# Patient Record
Sex: Female | Born: 1990 | Race: White | Hispanic: No | Marital: Married | Smoking: Never smoker
Health system: Southern US, Community
[De-identification: ages and names within clinical notes are randomized; demographics above are authoritative.]

## PROBLEM LIST (undated history)

## (undated) DIAGNOSIS — Z8619 Personal history of other infectious and parasitic diseases: Secondary | ICD-10-CM

## (undated) DIAGNOSIS — R091 Pleurisy: Secondary | ICD-10-CM

## (undated) DIAGNOSIS — F329 Major depressive disorder, single episode, unspecified: Secondary | ICD-10-CM

## (undated) DIAGNOSIS — F419 Anxiety disorder, unspecified: Secondary | ICD-10-CM

## (undated) DIAGNOSIS — O24419 Gestational diabetes mellitus in pregnancy, unspecified control: Secondary | ICD-10-CM

## (undated) DIAGNOSIS — F32A Depression, unspecified: Secondary | ICD-10-CM

## (undated) DIAGNOSIS — I2699 Other pulmonary embolism without acute cor pulmonale: Secondary | ICD-10-CM

## (undated) HISTORY — PX: CHOLECYSTECTOMY: SHX55

---

## 2015-01-13 LAB — OB RESULTS CONSOLE ANTIBODY SCREEN: Antibody Screen: NEGATIVE

## 2015-01-13 LAB — OB RESULTS CONSOLE HIV ANTIBODY (ROUTINE TESTING): HIV: NONREACTIVE

## 2015-01-13 LAB — OB RESULTS CONSOLE RPR: RPR: NONREACTIVE

## 2015-01-13 LAB — OB RESULTS CONSOLE GC/CHLAMYDIA
CHLAMYDIA, DNA PROBE: NEGATIVE
Gonorrhea: NEGATIVE

## 2015-01-13 LAB — OB RESULTS CONSOLE ABO/RH: RH TYPE: POSITIVE

## 2015-01-13 LAB — OB RESULTS CONSOLE HEPATITIS B SURFACE ANTIGEN: HEP B S AG: NEGATIVE

## 2015-01-13 LAB — OB RESULTS CONSOLE GBS: GBS: POSITIVE

## 2015-01-13 LAB — OB RESULTS CONSOLE RUBELLA ANTIBODY, IGM: Rubella: IMMUNE

## 2015-08-04 ENCOUNTER — Encounter (HOSPITAL_COMMUNITY): Payer: Self-pay

## 2015-08-04 ENCOUNTER — Telehealth (HOSPITAL_COMMUNITY): Payer: Self-pay | Admitting: *Deleted

## 2015-08-04 NOTE — H&P (Signed)
Brianna Russell  DICTATION # [id number] CSN# [id number]   Brianna PicaHOLLAND,Brianna Fabrizio M, MD 08/04/2015 6:44 PM

## 2015-08-04 NOTE — Telephone Encounter (Signed)
Preadmission screen  

## 2015-08-05 ENCOUNTER — Inpatient Hospital Stay (HOSPITAL_COMMUNITY): Payer: 59 | Admitting: Anesthesiology

## 2015-08-05 ENCOUNTER — Encounter (HOSPITAL_COMMUNITY): Payer: Self-pay

## 2015-08-05 ENCOUNTER — Inpatient Hospital Stay (HOSPITAL_COMMUNITY)
Admission: AD | Admit: 2015-08-05 | Discharge: 2015-08-07 | DRG: 765 | Disposition: A | Discharge status: Home / Self Care | Payer: 59 | Source: Ambulatory Visit | Attending: Obstetrics and Gynecology | Admitting: Obstetrics and Gynecology

## 2015-08-05 ENCOUNTER — Encounter (HOSPITAL_COMMUNITY)
Admission: AD | Disposition: A | Discharge status: Home / Self Care | Payer: Self-pay | Source: Ambulatory Visit | Attending: Obstetrics and Gynecology

## 2015-08-05 DIAGNOSIS — O34211 Maternal care for low transverse scar from previous cesarean delivery: Secondary | ICD-10-CM | POA: Diagnosis present

## 2015-08-05 DIAGNOSIS — O24429 Gestational diabetes mellitus in childbirth, unspecified control: Principal | ICD-10-CM | POA: Diagnosis present

## 2015-08-05 DIAGNOSIS — Z6841 Body Mass Index (BMI) 40.0 and over, adult: Secondary | ICD-10-CM

## 2015-08-05 DIAGNOSIS — Z3A37 37 weeks gestation of pregnancy: Secondary | ICD-10-CM

## 2015-08-05 DIAGNOSIS — Z9119 Patient's noncompliance with other medical treatment and regimen: Secondary | ICD-10-CM | POA: Diagnosis not present

## 2015-08-05 DIAGNOSIS — O99824 Streptococcus B carrier state complicating childbirth: Secondary | ICD-10-CM | POA: Diagnosis present

## 2015-08-05 DIAGNOSIS — O99214 Obesity complicating childbirth: Secondary | ICD-10-CM | POA: Diagnosis present

## 2015-08-05 DIAGNOSIS — Z3490 Encounter for supervision of normal pregnancy, unspecified, unspecified trimester: Secondary | ICD-10-CM

## 2015-08-05 DIAGNOSIS — Z87891 Personal history of nicotine dependence: Secondary | ICD-10-CM | POA: Diagnosis not present

## 2015-08-05 LAB — TYPE AND SCREEN
ABO/RH(D): A POS
Antibody Screen: NEGATIVE

## 2015-08-05 LAB — BASIC METABOLIC PANEL
Anion gap: 8 (ref 5–15)
BUN: 8 mg/dL (ref 6–20)
CHLORIDE: 106 mmol/L (ref 101–111)
CO2: 24 mmol/L (ref 22–32)
Calcium: 9.3 mg/dL (ref 8.9–10.3)
Creatinine, Ser: 0.47 mg/dL (ref 0.44–1.00)
GFR calc Af Amer: >60 mL/min (ref >60)
GFR calc non Af Amer: >60 mL/min (ref >60)
GLUCOSE: 99 mg/dL (ref 65–99)
POTASSIUM: 4.2 mmol/L (ref 3.5–5.1)
Sodium: 138 mmol/L (ref 135–145)

## 2015-08-05 LAB — CBC
HEMATOCRIT: 35.5 % — AB (ref 36.0–46.0)
Hemoglobin: 11.6 g/dL — ABNORMAL LOW (ref 12.0–15.0)
MCH: 29.8 pg (ref 26.0–34.0)
MCHC: 32.7 g/dL (ref 30.0–36.0)
MCV: 91.3 fL (ref 78.0–100.0)
PLATELETS: 211 10*3/uL (ref 150–400)
RBC: 3.89 MIL/uL (ref 3.87–5.11)
RDW: 13.8 % (ref 11.5–15.5)
WBC: 5.8 10*3/uL (ref 4.0–10.5)

## 2015-08-05 LAB — GLUCOSE, CAPILLARY: GLUCOSE-CAPILLARY: 93 mg/dL (ref 65–99)

## 2015-08-05 LAB — ABO/RH: ABO/RH(D): A POS

## 2015-08-05 LAB — RPR: RPR: NONREACTIVE

## 2015-08-05 SURGERY — Surgical Case
Anesthesia: Spinal

## 2015-08-05 MED ORDER — SODIUM CHLORIDE 0.9% FLUSH: 3.0000 mL | Freq: Two times a day (BID) | INTRAVENOUS | Status: DC

## 2015-08-05 MED ORDER — ACETAMINOPHEN 160 MG/5ML PO SOLN: 325.0000 mg | ORAL | Status: DC | PRN

## 2015-08-05 MED ORDER — ONDANSETRON HCL 4 MG/2ML IJ SOLN
INTRAMUSCULAR | Status: AC
  Filled 2015-08-05: qty 2

## 2015-08-05 MED ORDER — LACTATED RINGERS IV SOLN
INTRAVENOUS | Status: DC | PRN
  Administered 2015-08-05 (×3): via INTRAVENOUS

## 2015-08-05 MED ORDER — DIBUCAINE 1 % RE OINT: 1.0000 "application " | TOPICAL_OINTMENT | RECTAL | Status: DC | PRN

## 2015-08-05 MED ORDER — NALBUPHINE HCL 10 MG/ML IJ SOLN: 5.0000 mg | Freq: Once | INTRAMUSCULAR | Status: DC | PRN

## 2015-08-05 MED ORDER — MORPHINE SULFATE (PF) 0.5 MG/ML IJ SOLN
INTRAMUSCULAR | Status: DC | PRN
  Administered 2015-08-05: .15 mg via EPIDURAL

## 2015-08-05 MED ORDER — NALBUPHINE HCL 10 MG/ML IJ SOLN: 5.0000 mg | INTRAMUSCULAR | Status: DC | PRN

## 2015-08-05 MED ORDER — KETOROLAC TROMETHAMINE 30 MG/ML IJ SOLN: 30.0000 mg | Freq: Four times a day (QID) | INTRAMUSCULAR | Status: DC | PRN

## 2015-08-05 MED ORDER — SODIUM CHLORIDE 0.9% FLUSH: 3.0000 mL | INTRAVENOUS | Status: DC | PRN

## 2015-08-05 MED ORDER — SODIUM CHLORIDE 0.9 % IV SOLN: 250.0000 mL | INTRAVENOUS | Status: DC

## 2015-08-05 MED ORDER — TETANUS-DIPHTH-ACELL PERTUSSIS 5-2.5-18.5 LF-MCG/0.5 IM SUSP: 0.5000 mL | Freq: Once | INTRAMUSCULAR | Status: DC

## 2015-08-05 MED ORDER — KETOROLAC TROMETHAMINE 30 MG/ML IJ SOLN
30.0000 mg | Freq: Four times a day (QID) | INTRAMUSCULAR | Status: DC | PRN
  Administered 2015-08-05: 30 mg via INTRAMUSCULAR

## 2015-08-05 MED ORDER — LACTATED RINGERS IV SOLN
INTRAVENOUS | Status: DC
  Administered 2015-08-05: 07:00:00 via INTRAVENOUS

## 2015-08-05 MED ORDER — MENTHOL 3 MG MT LOZG: 1.0000 | LOZENGE | OROMUCOSAL | Status: DC | PRN

## 2015-08-05 MED ORDER — DEXTROSE 5 % IV SOLN
1.0000 ug/kg/h | INTRAVENOUS | Status: DC | PRN
  Filled 2015-08-05: qty 2

## 2015-08-05 MED ORDER — FENTANYL CITRATE (PF) 100 MCG/2ML IJ SOLN
INTRAMUSCULAR | Status: AC
  Filled 2015-08-05: qty 2

## 2015-08-05 MED ORDER — OXYCODONE HCL 5 MG/5ML PO SOLN
5.0000 mg | ORAL | Status: DC | PRN
  Administered 2015-08-06 (×4): 5 mg via ORAL
  Filled 2015-08-05 (×3): qty 5

## 2015-08-05 MED ORDER — SODIUM CHLORIDE 0.9 % IR SOLN
Status: DC | PRN
Start: 2015-08-05 — End: 2015-08-05
  Administered 2015-08-05: 1

## 2015-08-05 MED ORDER — ACETAMINOPHEN 325 MG PO TABS: 650.0000 mg | ORAL_TABLET | ORAL | Status: DC | PRN

## 2015-08-05 MED ORDER — LACTATED RINGERS IV SOLN
INTRAVENOUS | Status: DC | PRN
  Administered 2015-08-05: 09:00:00 via INTRAVENOUS

## 2015-08-05 MED ORDER — OXYCODONE HCL 5 MG/5ML PO SOLN
10.0000 mg | ORAL | Status: DC | PRN
  Administered 2015-08-05: 10 mg via ORAL
  Filled 2015-08-05 (×2): qty 10

## 2015-08-05 MED ORDER — PHENYLEPHRINE 40 MCG/ML (10ML) SYRINGE FOR IV PUSH (FOR BLOOD PRESSURE SUPPORT)
PREFILLED_SYRINGE | INTRAVENOUS | Status: AC
  Filled 2015-08-05: qty 10

## 2015-08-05 MED ORDER — MEPERIDINE HCL 25 MG/ML IJ SOLN: 6.2500 mg | INTRAMUSCULAR | Status: DC | PRN

## 2015-08-05 MED ORDER — IBUPROFEN 800 MG PO TABS: 800.0000 mg | ORAL_TABLET | Freq: Three times a day (TID) | ORAL | Status: DC | PRN

## 2015-08-05 MED ORDER — OXYCODONE-ACETAMINOPHEN 5-325 MG PO TABS: 2.0000 | ORAL_TABLET | ORAL | Status: DC | PRN

## 2015-08-05 MED ORDER — OXYTOCIN 10 UNIT/ML IJ SOLN
INTRAMUSCULAR | Status: AC
  Filled 2015-08-05: qty 4

## 2015-08-05 MED ORDER — DIPHENHYDRAMINE HCL 25 MG PO CAPS
25.0000 mg | ORAL_CAPSULE | ORAL | Status: DC | PRN
  Filled 2015-08-05: qty 1

## 2015-08-05 MED ORDER — ONDANSETRON HCL 4 MG/2ML IJ SOLN: 4.0000 mg | Freq: Three times a day (TID) | INTRAMUSCULAR | Status: DC | PRN

## 2015-08-05 MED ORDER — OXYTOCIN 40 UNITS IN LACTATED RINGERS INFUSION - SIMPLE MED
INTRAVENOUS | Status: DC | PRN
  Administered 2015-08-05: 40 [IU] via INTRAVENOUS

## 2015-08-05 MED ORDER — MEPERIDINE HCL 25 MG/ML IJ SOLN
INTRAMUSCULAR | Status: AC
  Filled 2015-08-05: qty 1

## 2015-08-05 MED ORDER — SIMETHICONE 80 MG PO CHEW: 80.0000 mg | CHEWABLE_TABLET | ORAL | Status: DC | PRN

## 2015-08-05 MED ORDER — COCONUT OIL OIL: 1.0000 "application " | TOPICAL_OIL | Status: DC | PRN

## 2015-08-05 MED ORDER — IBUPROFEN 100 MG/5ML PO SUSP
800.0000 mg | Freq: Three times a day (TID) | ORAL | Status: DC | PRN
  Administered 2015-08-05 – 2015-08-07 (×6): 800 mg via ORAL
  Filled 2015-08-05 (×7): qty 40

## 2015-08-05 MED ORDER — DEXTROSE 5 % IV SOLN
2.0000 g | INTRAVENOUS | Status: AC
  Administered 2015-08-05: 2 g via INTRAVENOUS
  Filled 2015-08-05: qty 2

## 2015-08-05 MED ORDER — SENNOSIDES-DOCUSATE SODIUM 8.6-50 MG PO TABS
2.0000 | ORAL_TABLET | ORAL | Status: DC
  Filled 2015-08-05: qty 2

## 2015-08-05 MED ORDER — SIMETHICONE 80 MG PO CHEW
80.0000 mg | CHEWABLE_TABLET | Freq: Three times a day (TID) | ORAL | Status: DC
  Administered 2015-08-05 – 2015-08-06 (×3): 80 mg via ORAL
  Filled 2015-08-05 (×4): qty 1

## 2015-08-05 MED ORDER — OXYTOCIN 40 UNITS IN LACTATED RINGERS INFUSION - SIMPLE MED: 2.5000 [IU]/h | INTRAVENOUS | Status: AC

## 2015-08-05 MED ORDER — SCOPOLAMINE 1 MG/3DAYS TD PT72
MEDICATED_PATCH | TRANSDERMAL | Status: AC
  Administered 2015-08-05: 1.5 mg via TRANSDERMAL
  Filled 2015-08-05: qty 1

## 2015-08-05 MED ORDER — PHENYLEPHRINE 8 MG IN D5W 100 ML (0.08MG/ML) PREMIX OPTIME
INJECTION | INTRAVENOUS | Status: DC | PRN
  Administered 2015-08-05: 60 ug/min via INTRAVENOUS

## 2015-08-05 MED ORDER — DIPHENHYDRAMINE HCL 25 MG PO CAPS: 25.0000 mg | ORAL_CAPSULE | Freq: Four times a day (QID) | ORAL | Status: DC | PRN

## 2015-08-05 MED ORDER — LACTATED RINGERS IV SOLN
INTRAVENOUS | Status: DC
  Administered 2015-08-05: 19:00:00 via INTRAVENOUS

## 2015-08-05 MED ORDER — PRENATAL MULTIVITAMIN CH: 1.0000 | ORAL_TABLET | Freq: Every day | ORAL | Status: DC

## 2015-08-05 MED ORDER — SIMETHICONE 80 MG PO CHEW
80.0000 mg | CHEWABLE_TABLET | ORAL | Status: DC
Start: 2015-08-06 — End: 2015-08-07
  Administered 2015-08-05 – 2015-08-06 (×2): 80 mg via ORAL
  Filled 2015-08-05 (×2): qty 1

## 2015-08-05 MED ORDER — PHENYLEPHRINE 8 MG IN D5W 100 ML (0.08MG/ML) PREMIX OPTIME
INJECTION | INTRAVENOUS | Status: AC
Start: 2015-08-05 — End: 2015-08-05
  Filled 2015-08-05: qty 100

## 2015-08-05 MED ORDER — BISACODYL 10 MG RE SUPP: 10.0000 mg | Freq: Every day | RECTAL | Status: DC | PRN

## 2015-08-05 MED ORDER — WITCH HAZEL-GLYCERIN EX PADS: 1.0000 "application " | MEDICATED_PAD | CUTANEOUS | Status: DC | PRN

## 2015-08-05 MED ORDER — BUPIVACAINE IN DEXTROSE 0.75-8.25 % IT SOLN
INTRATHECAL | Status: DC | PRN
  Administered 2015-08-05: 1.8 mL via INTRATHECAL

## 2015-08-05 MED ORDER — NALOXONE HCL 0.4 MG/ML IJ SOLN: 0.4000 mg | INTRAMUSCULAR | Status: DC | PRN

## 2015-08-05 MED ORDER — ACETAMINOPHEN 160 MG/5ML PO SOLN: 650.0000 mg | ORAL | Status: DC | PRN

## 2015-08-05 MED ORDER — FLEET ENEMA 7-19 GM/118ML RE ENEM: 1.0000 | ENEMA | Freq: Every day | RECTAL | Status: DC | PRN

## 2015-08-05 MED ORDER — PHENYLEPHRINE HCL 10 MG/ML IJ SOLN
INTRAMUSCULAR | Status: DC | PRN
  Administered 2015-08-05: 80 ug via INTRAVENOUS
  Administered 2015-08-05: 40 ug via INTRAVENOUS
  Administered 2015-08-05: 80 ug via INTRAVENOUS

## 2015-08-05 MED ORDER — COMPLETENATE 29-1 MG PO CHEW
1.0000 | CHEWABLE_TABLET | Freq: Every day | ORAL | Status: DC
  Administered 2015-08-05 – 2015-08-06 (×2): 1 via ORAL
  Filled 2015-08-05 (×3): qty 1

## 2015-08-05 MED ORDER — KETOROLAC TROMETHAMINE 30 MG/ML IJ SOLN
INTRAMUSCULAR | Status: AC
  Filled 2015-08-05: qty 1

## 2015-08-05 MED ORDER — DIPHENHYDRAMINE HCL 50 MG/ML IJ SOLN: 12.5000 mg | INTRAMUSCULAR | Status: DC | PRN

## 2015-08-05 MED ORDER — MORPHINE SULFATE (PF) 0.5 MG/ML IJ SOLN
INTRAMUSCULAR | Status: AC
  Filled 2015-08-05: qty 10

## 2015-08-05 MED ORDER — ZOLPIDEM TARTRATE 5 MG PO TABS
5.0000 mg | ORAL_TABLET | Freq: Every evening | ORAL | Status: DC | PRN
Start: 2015-08-05 — End: 2015-08-07

## 2015-08-05 MED ORDER — SCOPOLAMINE 1 MG/3DAYS TD PT72
1.0000 | MEDICATED_PATCH | Freq: Once | TRANSDERMAL | Status: DC
  Administered 2015-08-05: 1.5 mg via TRANSDERMAL

## 2015-08-05 MED ORDER — ONDANSETRON HCL 4 MG/2ML IJ SOLN
INTRAMUSCULAR | Status: DC | PRN
  Administered 2015-08-05: 4 mg via INTRAVENOUS

## 2015-08-05 MED ORDER — MEPERIDINE HCL 25 MG/ML IJ SOLN
INTRAMUSCULAR | Status: DC | PRN
  Administered 2015-08-05 (×2): 12.5 mg via INTRAVENOUS

## 2015-08-05 MED ORDER — MEASLES, MUMPS & RUBELLA VAC ~~LOC~~ INJ: 0.5000 mL | INJECTION | Freq: Once | SUBCUTANEOUS | Status: DC

## 2015-08-05 MED ORDER — CEFAZOLIN SODIUM-DEXTROSE 2-4 GM/100ML-% IV SOLN: 2.0000 g | INTRAVENOUS | Status: DC

## 2015-08-05 MED ORDER — OXYCODONE-ACETAMINOPHEN 5-325 MG PO TABS: 1.0000 | ORAL_TABLET | ORAL | Status: DC | PRN

## 2015-08-05 MED ORDER — HYDROMORPHONE HCL 1 MG/ML IJ SOLN: 0.2500 mg | INTRAMUSCULAR | Status: DC | PRN

## 2015-08-05 SURGICAL SUPPLY — 35 items
BENZOIN TINCTURE PRP APPL 2/3 (GAUZE/BANDAGES/DRESSINGS) ×3 IMPLANT
CLAMP CORD UMBIL (MISCELLANEOUS) IMPLANT
CLOSURE STERI STRIP 1/2 X4 (GAUZE/BANDAGES/DRESSINGS) ×6 IMPLANT
CLOSURE WOUND 1/2 X4 (GAUZE/BANDAGES/DRESSINGS) ×1
CLOTH BEACON ORANGE TIMEOUT ST (SAFETY) ×3 IMPLANT
DRSG OPSITE POSTOP 4X10 (GAUZE/BANDAGES/DRESSINGS) ×3 IMPLANT
DURAPREP 26ML APPLICATOR (WOUND CARE) ×3 IMPLANT
ELECT REM PT RETURN 9FT ADLT (ELECTROSURGICAL) ×3
ELECTRODE REM PT RTRN 9FT ADLT (ELECTROSURGICAL) ×1 IMPLANT
EXTRACTOR VACUUM M CUP 4 TUBE (SUCTIONS) IMPLANT
EXTRACTOR VACUUM M CUP 4' TUBE (SUCTIONS)
GLOVE BIO SURGEON STRL SZ7 (GLOVE) ×3 IMPLANT
GLOVE BIOGEL PI IND STRL 7.0 (GLOVE) ×4 IMPLANT
GLOVE BIOGEL PI INDICATOR 7.0 (GLOVE) ×8
GLOVE ECLIPSE 7.0 STRL STRAW (GLOVE) ×3 IMPLANT
GOWN STRL REUS W/TWL LRG LVL3 (GOWN DISPOSABLE) ×6 IMPLANT
KIT ABG SYR 3ML LUER SLIP (SYRINGE) IMPLANT
NEEDLE HYPO 25X5/8 SAFETYGLIDE (NEEDLE) ×3 IMPLANT
NS IRRIG 1000ML POUR BTL (IV SOLUTION) ×3 IMPLANT
PACK C SECTION WH (CUSTOM PROCEDURE TRAY) ×3 IMPLANT
PAD OB MATERNITY 4.3X12.25 (PERSONAL CARE ITEMS) ×3 IMPLANT
PENCIL SMOKE EVAC W/HOLSTER (ELECTROSURGICAL) ×3 IMPLANT
RETRACTOR TRAXI PANNICULUS (MISCELLANEOUS) ×1 IMPLANT
STRIP CLOSURE SKIN 1/2X4 (GAUZE/BANDAGES/DRESSINGS) ×2 IMPLANT
SUT CHROMIC 0 CTX 36 (SUTURE) ×9 IMPLANT
SUT MON AB 4-0 PS1 27 (SUTURE) ×3 IMPLANT
SUT PDS AB 0 CT1 27 (SUTURE) ×6 IMPLANT
SUT PDS AB 0 CTX 60 (SUTURE) ×3 IMPLANT
SUT VIC AB 3-0 CT1 27 (SUTURE) ×4
SUT VIC AB 3-0 CT1 TAPERPNT 27 (SUTURE) ×2 IMPLANT
SUT VIC AB 3-0 SH 27 (SUTURE) ×2
SUT VIC AB 3-0 SH 27X BRD (SUTURE) ×1 IMPLANT
TOWEL OR 17X24 6PK STRL BLUE (TOWEL DISPOSABLE) ×3 IMPLANT
TRAXI PANNICULUS RETRACTOR (MISCELLANEOUS) ×2
TRAY FOLEY CATH SILVER 14FR (SET/KITS/TRAYS/PACK) ×3 IMPLANT

## 2015-08-05 NOTE — Op Note (Signed)
Preoperative diagnosis: 37+ week gestation, poorly controlled gestational diabetes, noncompliance with treatment protocol, previous cesarean section, declines trial of labor  Postoperative diagnosis: Same  Procedure: Repeat low transverse cesarean section  Surgeon: Marcelle OverlieHolland  Anesthesia: Spinal  EBL: 800 cc  Procedure and findings:  Patient was taken the operating room after an adequate level spinal anesthetic was obtained with the patient in left tilt position, the abdomen prepped and draped per protocol Foley catheter position. Appropriate timeouts taken at that point. Transverse incision made tibial scar which is well-healed this carried down the fascia which was incised and extended transversely. Rectus muscle divided in the midline, peritoneum entered superiorly without incident and extended in a vertical fashion. There were some minimal omental adhesions into the anterior abdominal wall which were lysed in an avascular plane and the omentum was reduced above. Bladder blade was positioned the bladder flap was created. Transverse incision made in the lower segment this was extended with blunt dissection clear fluid noted the patient then delivered of a healthy infant Apgars 9 and 9, the infant was suctioned, cord clamped and passed the pediatric team for further care. The placenta was then delivered manually intact. Uterus exteriorized, cavity wiped clean with laparotomy pack, closure obtained the first layer of 0 chromic in a locked fashion followed by number cutting layer of 0 chromic. This is hemostatic bilateral tubes and ovaries were normal. Prior to closure, sponge, needle, history precast reported as correct 2. Peritoneum closed with 3-0 Vicryl running suture the same on the rectus muscles in the midline a double-arm 0 PDS used to reapproximate the fascia subcutaneous tissue was undermined to reduce tension this is made hemostatic with the Bovie was not closed separately was fairly thin 4-0  Monocryl subcuticular saying skin closure with a pressure dressing, she tolerated this well, went to recovery room in good condition. Clear urine noted in the case.  Dictated with [facility]  Brianna Russell Milana ObeyM Mehek Grega M.D.

## 2015-08-05 NOTE — H&P (Signed)
NAME:  Brianna Russell, Cortina                     ACCOUNT NO.:  MEDICAL RECORD NO.:  1  LOCATION:                                 FACILITY:  PHYSICIAN:  Teasia Zapf M. Marcelle OverlieHolland, M.D.DATE OF BIRTH:  [birth year]  DATE OF ADMISSION: DATE OF DISCHARGE:                             HISTORY & PHYSICAL   CHIEF COMPLAINT:  Gestational diabetes, noncompliant, prior macrosomia, previous cesarean section, for repeat cesarean section.  HISTORY OF PRESENT ILLNESS:  A 25 year old, G2, P1-0-0-1 at 37 weeks 2 days was seen in the office yesterday BP 112/68.  Urine negative.  NST was reactive.  Due to noncompliance on her sugar control, was had her schedule repeat section removed per Dr. Rana SnareLowe after discussion with Dr. Harlon FlorWhitaker.  The repeat cesarean section including specific risks related to bleeding, infection, transfusion, adjacent organ injury, wound infection, phlebitis along with her expected recovery time all reviewed, which she understands and accepts.  She does have a positive GBS culture in her urine.  Blood type is A positive.  PAST MEDICAL HISTORY:  Previous cesarean in 2012, a 10 pound 4 ounce female.  Please see the [facility] form for the remainder of her social and family history.  PHYSICAL EXAMINATION:  VITAL SIGNS:  Temp 98.2, blood pressure 112/68. HEENT:  Unremarkable. NECK:  Supple.  No masses. LUNGS:  Clear. CARDIOVASCULAR:  Regular rate and rhythm without murmurs, rubs, gallops noted. BREASTS:  Not examined. GU:  Fundal height was 40 cm.  Fetal heart rate 142.  Cervix was closed. EXTREMITIES:  Unremarkable. NEUROLOGIC:  Unremarkable.  IMPRESSION: 1. Thirty-seven week 3 day intrauterine pregnancy.  Previous cesarean     section for macrosomia. 2. Gestational diabetes, noncompliant.  PLAN:  Repeat cesarean section.  Procedure and risks reviewed as above.     Renay Crammer M. Marcelle OverlieHolland, M.D.     RMH/MEDQ  D:  08/04/2015  T:  08/04/2015  Job:  [id number]

## 2015-08-05 NOTE — Anesthesia Postprocedure Evaluation (Signed)
Anesthesia Post Note  Patient: Micael HampshireSarah D Rankin  Procedure(s) Performed: Procedure(s) (LRB): REPEAT CESAREAN SECTION (N/A)  Patient location during evaluation: Mother Baby Anesthesia Type: Spinal Level of consciousness: oriented and awake and alert Pain management: pain level controlled Vital Signs Assessment: post-procedure vital signs reviewed and stable Respiratory status: spontaneous breathing and respiratory function stable Cardiovascular status: blood pressure returned to baseline and stable Postop Assessment: no headache and no backache Anesthetic complications: no     Last Vitals:  Filed Vitals:   08/05/15 1224 08/05/15 1326  BP: 109/49 107/50  Pulse: 66 70  Temp: 36.5 C 37.2 C  Resp: 18 18    Last Pain:  Filed Vitals:   08/05/15 1330  PainSc: 0-No pain   Pain Goal:                 Amare Bail

## 2015-08-05 NOTE — Lactation Note (Signed)
This note was copied from a baby's chart. Lactation Consultation Note Initial visit at 9 hours of age.  Mom reports a few attempts with last feeding about 2 hours ago with sucking on and off for about 15 minutes.  Mom noted to have flat compressible nipples with left nipple slightly inverting with compression. Mom is able to hand express a few drops of colostrum.  LC gave mom hand pump with instructions on use and cleaning.  Encouraged mom to attempt to evert nipple prior to latching.  Mom is recovering from a c/s and needed assist with positioning baby.  Few drops expressed to mouth, but baby is too sleepy to latch.  Baby remains STS with mom. Discussed possible use of DEBP if baby is not latching well soon and discussed option of spoon feeding EBM if baby is eager, but not latching well.   [facility] resources given and discussed.  Encouraged to feed with early cues on demand.  Early newborn behavior discussed. Mom to call for assist as needed.       Patient Name: Brianna Russell [id number]'s Date: 08/05/2015 Reason for consult: Initial assessment   Maternal Data Has patient been taught Hand Expression?: Yes Does the patient have breastfeeding experience prior to this delivery?: Yes  Feeding Feeding Type: Breast Fed Length of feed: 15 min  LATCH Score/Interventions Latch: Too sleepy or reluctant, no latch achieved, no sucking elicited.  Audible Swallowing: None  Type of Nipple: Flat Intervention(s): Hand pump  Comfort (Breast/Nipple): Soft / non-tender     Hold (Positioning): Assistance needed to correctly position infant at breast and maintain latch. Intervention(s): Breastfeeding basics reviewed;Support Pillows;Position options;Skin to skin  LATCH Score: 4  Lactation Tools Discussed/Used Pump Review: Setup, frequency, and cleaning;Milk Storage Initiated by:: JS Date initiated:: 08/05/15   Consult Status Consult Status: Follow-up Date: 08/06/15 Follow-up type:  In-patient    Brianna Russell, Brianna Russell 08/05/2015, 6:06 PM

## 2015-08-05 NOTE — Anesthesia Preprocedure Evaluation (Addendum)
Anesthesia Evaluation  Patient identified by MRN, date of birth, ID band Patient awake    Reviewed: Allergy & Precautions, NPO status , Patient's Chart, lab work & pertinent test results  Airway Mallampati: II  TM Distance: >3 FB Neck ROM: Full    Dental no notable dental hx.    Pulmonary neg pulmonary ROS, former smoker,    Pulmonary exam normal breath sounds clear to auscultation       Cardiovascular negative cardio ROS Normal cardiovascular exam Rhythm:Regular Rate:Normal     Neuro/Psych PSYCHIATRIC DISORDERS Anxiety Depression negative neurological ROS     GI/Hepatic negative GI ROS, Neg liver ROS,   Endo/Other  Morbid obesity  Renal/GU negative Renal ROS     Musculoskeletal negative musculoskeletal ROS (+)   Abdominal   Peds  Hematology negative hematology ROS (+)   Anesthesia Other Findings   Reproductive/Obstetrics negative OB ROS                            Anesthesia Physical Anesthesia Plan  ASA: III  Anesthesia Plan: Spinal   Post-op Pain Management:    Induction: Intravenous  Airway Management Planned:   Additional Equipment:   Intra-op Plan:   Post-operative Plan:   Informed Consent: I have reviewed the patients History and Physical, chart, labs and discussed the procedure including the risks, benefits and alternatives for the proposed anesthesia with the patient or authorized representative who has indicated his/her understanding and acceptance.   Dental advisory given  Plan Discussed with: CRNA  Anesthesia Plan Comments:        Anesthesia Quick Evaluation

## 2015-08-05 NOTE — Anesthesia Procedure Notes (Signed)
Spinal Patient location during procedure: OR Staffing Anesthesiologist: Nolon Nations Performed by: anesthesiologist  Preanesthetic Checklist Completed: patient identified, site marked, surgical consent, pre-op evaluation, timeout performed, IV checked, risks and benefits discussed and monitors and equipment checked Spinal Block Patient position: sitting Prep: DuraPrep Patient monitoring: heart rate, continuous pulse ox and blood pressure Location: L3-4 Injection technique: single-shot Needle Needle type: Sprotte  Needle gauge: 24 G Needle length: 9 cm Assessment Sensory level: T8 Additional Notes Expiration date of kit checked and confirmed. Patient tolerated procedure well, without complications.

## 2015-08-05 NOTE — Progress Notes (Signed)
The patient was re-examined with no change in status 

## 2015-08-05 NOTE — Transfer of Care (Signed)
Immediate Anesthesia Transfer of Care Note  Patient: Brianna HampshireSarah D Noseworthy  Procedure(s) Performed: Procedure(s) with comments: REPEAT CESAREAN SECTION (N/A) - Tracey T to RNFA - confirmed edc 08/23/15    Patient Location: PACU  Anesthesia Type:Spinal  Level of Consciousness: awake, alert  and oriented  Airway & Oxygen Therapy: Patient Spontanous Breathing  Post-op Assessment: Report given to RN and Post -op Vital signs reviewed and stable  Post vital signs: Reviewed and stable  Last Vitals:  Filed Vitals:   08/05/15 0706  BP: 106/73  Pulse: 93  Temp: 37.4 C  Resp: 22    Last Pain: There were no vitals filed for this visit.       Complications: No apparent anesthesia complications

## 2015-08-06 LAB — COMPREHENSIVE METABOLIC PANEL
ALBUMIN: 2.3 g/dL — AB (ref 3.5–5.0)
ALK PHOS: 89 U/L (ref 38–126)
ALT: 16 U/L (ref 14–54)
ANION GAP: 6 (ref 5–15)
AST: 20 U/L (ref 15–41)
BUN: 6 mg/dL (ref 6–20)
CALCIUM: 9 mg/dL (ref 8.9–10.3)
CHLORIDE: 104 mmol/L (ref 101–111)
CO2: 26 mmol/L (ref 22–32)
CREATININE: 0.48 mg/dL (ref 0.44–1.00)
GFR calc Af Amer: >60 mL/min (ref >60)
GFR calc non Af Amer: >60 mL/min (ref >60)
GLUCOSE: 121 mg/dL — AB (ref 65–99)
Potassium: 4.1 mmol/L (ref 3.5–5.1)
SODIUM: 136 mmol/L (ref 135–145)
Total Bilirubin: 0.4 mg/dL (ref 0.3–1.2)
Total Protein: 5.6 g/dL — ABNORMAL LOW (ref 6.5–8.1)

## 2015-08-06 LAB — CBC
HEMATOCRIT: 30.8 % — AB (ref 36.0–46.0)
HEMOGLOBIN: 10.2 g/dL — AB (ref 12.0–15.0)
MCH: 30.4 pg (ref 26.0–34.0)
MCHC: 33.1 g/dL (ref 30.0–36.0)
MCV: 91.7 fL (ref 78.0–100.0)
PLATELETS: 182 10*3/uL (ref 150–400)
RBC: 3.36 MIL/uL — ABNORMAL LOW (ref 3.87–5.11)
RDW: 14 % (ref 11.5–15.5)
WBC: 8.6 10*3/uL (ref 4.0–10.5)

## 2015-08-06 MED ORDER — DOCUSATE SODIUM 50 MG/5ML PO LIQD
100.0000 mg | Freq: Every day | ORAL | Status: DC
  Administered 2015-08-06 (×2): 100 mg via ORAL
  Filled 2015-08-06 (×3): qty 10

## 2015-08-06 NOTE — Progress Notes (Signed)
Subjective: Postpartum Day 1: Cesarean Delivery Patient reports tolerating PO.    Objective: Vital signs in last 24 hours: Temp:  [97.6 F (36.4 C)-98.9 F (37.2 C)] 98.5 F (36.9 C) (05/27 0325) Pulse Rate:  [62-102] 100 (05/27 0325) Resp:  [9-23] 20 (05/27 0325) BP: (80-124)/(46-69) 124/55 mmHg (05/27 0325) SpO2:  [97 %-100 %] 99 % (05/27 0325)  Physical Exam:  General: alert Lochia: appropriate Uterine Fundus: firm Incision: healing well DVT Evaluation: No evidence of DVT seen on physical exam.   Recent Labs  08/05/15 0650 08/06/15 0554  HGB 11.6* 10.2*  HCT 35.5* 30.8*   CBG (last 3)   Recent Labs  08/05/15 0951  GLUCAP 93    Assessment/Plan: Status post Cesarean section. Doing well postoperatively.  Continue current care.  Meriel PicaHOLLAND,Eleana Tocco M 08/06/2015, 9:28 AM

## 2015-08-06 NOTE — Clinical Social Work Maternal (Signed)
CLINICAL SOCIAL WORK MATERNAL/CHILD NOTE  Patient Details  Name: Brianna Russell MRN: [id number] Date of Birth: 08/05/2016  Date: 08/06/2015  Clinical Social Worker Initiating Note: Erasmo Downer Dietrich Samuelson, LCSWDate/ Time Initiated: 08/06/15/    Child's Name: Unknown   Legal Guardian: Mother/Father   Need for Interpreter: None   Date of Referral: 08/05/15   Reason for Referral: Behavioral Health Issues, including SI    Referral Source: Physician   Address: [address], [place] [address]  Phone number: [phone]   Household Members: Spouse, Minor Children   Natural Supports (not living in the home): Parent, Immediate Family, Spouse/significant other   Professional Supports:None   Employment:Full-time   Type of Work:   Works in Industrial/product designer   Education:   [employer]   Other Resources:   N/A  Cultural/Religious Considerations Which May Impact Care: None reported by parents  Strengths: Ability to meet basic needs , Home prepared for child , Compliance with medical plan    Risk Factors/Current Problems: None   Cognitive State: Alert , Goal Oriented    Mood/Affect: Interested , Bright    CSW Assessment:CSW met with patient and husband to discuss consult for anxiety/depression. Patient denies current symptoms, reports "I guess I was a little after my last child."- reports that previous symptoms were directly related to body image issues after first pregnancy. She denies need for resources at this time. She lives with her husband and 12 year old daughter in [place]. Patient reports strong family support from husband, parents, and in-laws. They report being prepared at home for baby at discharge. Patient and husband have to questions or concerns at this time. Please reconsult if further social concerns arise.  CSW Plan/Description: No Further Intervention Required/No Barriers to  Discharge    Ladye Macnaughton, Casimiro Needle, LCSW 08/06/2015, 2:46 PM

## 2015-08-07 MED ORDER — IBUPROFEN 100 MG/5ML PO SUSP: 800.0000 mg | Freq: Three times a day (TID) | ORAL | Status: DC | PRN

## 2015-08-07 MED ORDER — OXYCODONE HCL 5 MG/5ML PO SOLN: 5.0000 mg | ORAL | Status: DC | PRN

## 2015-08-07 NOTE — Discharge Summary (Signed)
Obstetric Discharge Summary Reason for Admission: cesarean section Prenatal Procedures: GDM Intrapartum Procedures: cesarean: low cervical, transverse Postpartum Procedures: none Complications-Operative and Postpartum: none HEMOGLOBIN  Date Value Ref Range Status  08/06/2015 10.2* 12.0 - 15.0 g/dL Final   HCT  Date Value Ref Range Status  08/06/2015 30.8* 36.0 - 46.0 % Final   CBG (last 3)   Recent Labs  08/05/15 0951  GLUCAP 93    Physical Exam:  General: alert Lochia: appropriate Uterine Fundus: firm Incision: healing well DVT Evaluation: No evidence of DVT seen on physical exam.  Discharge Diagnoses: Term Pregnancy-delivered  Discharge Information: Date: 08/07/2015 Activity: pelvic rest Diet: routine Medications: PNV, Ibuprofen, Colace and Percocet Condition: stable Instructions: refer to practice specific booklet Discharge to: home Follow-up Information    Follow up with Meriel PicaHOLLAND,Loron Weimer M, MD. Schedule an appointment as soon as possible for a visit in 1 week.   Specialty:  Obstetrics and Gynecology   Contact information:   [address] [place] [id number] [phone]       Newborn Data: Live born female  Birth Weight: 8 lb 15 oz (4055 g) APGAR: 9, 9  Home with mother.  Marabella Popiel M 08/07/2015, 10:16 AM

## 2015-08-07 NOTE — Progress Notes (Signed)
Patient with a history of constipation.  This RN encouraged patient to walk frequently, suggested dietary modifications, gave prune and apple juice.

## 2015-08-08 ENCOUNTER — Encounter (HOSPITAL_COMMUNITY): Payer: Self-pay | Admitting: Obstetrics and Gynecology

## 2015-08-09 ENCOUNTER — Telehealth (HOSPITAL_COMMUNITY): Payer: Self-pay | Admitting: Lactation Services

## 2015-08-09 NOTE — Progress Notes (Signed)
08/09/15 Returned patient's phone call.  Mother stated she has had difficulty latching baby so had decided she was going to pump and give to breastmilk in a bottle.  She had only been able to pump 5-7 ml and her breasts are becoming uncomfortable and firm to touch.  She states she does not have a fever or any flu like symptoms at this time.  She went and bought formula and has not pumped in approx. 24 hours.  Suggest to mother if she wants to provided breastmilk to her baby she has to pump every 3 hours. Plan is: Apply ice packs (or frozen bags of vegetables) to both breasts top and bottom for 10-15 min q3 hr. Then massage breasts including axillary area. Hand express. Pump with DEBP for 10-15 min and hand express afterwards. If milk does not flow with pumping - apply warm compresses to both breasts for 5-10 min and retry pumping. Be careful about wearing too tight of a bra.  It should support breasts but not compress breasts. Watch for red, tender areas on breast and call MD if flu like symptoms and redness develop.  Offered mother OP appointment at 10:30 am tomorrow and on 6/8 but mother states she will call.

## 2015-08-15 ENCOUNTER — Encounter (HOSPITAL_COMMUNITY): Payer: Self-pay | Admitting: *Deleted

## 2015-08-15 ENCOUNTER — Encounter (HOSPITAL_COMMUNITY)
Admission: RE | Admit: 2015-08-15 | Discharge: 2015-08-15 | Disposition: A | Discharge status: Home / Self Care | Payer: Self-pay | Source: Ambulatory Visit

## 2015-08-15 HISTORY — DX: Major depressive disorder, single episode, unspecified: F32.9

## 2015-08-15 HISTORY — DX: Anxiety disorder, unspecified: F41.9

## 2015-08-15 HISTORY — DX: Depression, unspecified: F32.A

## 2015-08-15 HISTORY — DX: Personal history of other infectious and parasitic diseases: Z86.19

## 2015-08-19 ENCOUNTER — Emergency Department (HOSPITAL_COMMUNITY): Payer: 59

## 2015-08-19 ENCOUNTER — Emergency Department (HOSPITAL_COMMUNITY)
Admission: EM | Admit: 2015-08-19 | Discharge: 2015-08-20 | Disposition: A | Discharge status: Home / Self Care | Payer: 59 | Attending: Emergency Medicine | Admitting: Emergency Medicine

## 2015-08-19 ENCOUNTER — Encounter (HOSPITAL_COMMUNITY): Payer: Self-pay | Admitting: *Deleted

## 2015-08-19 DIAGNOSIS — Z87891 Personal history of nicotine dependence: Secondary | ICD-10-CM | POA: Diagnosis not present

## 2015-08-19 DIAGNOSIS — Z79899 Other long term (current) drug therapy: Secondary | ICD-10-CM | POA: Insufficient documentation

## 2015-08-19 DIAGNOSIS — F329 Major depressive disorder, single episode, unspecified: Secondary | ICD-10-CM | POA: Insufficient documentation

## 2015-08-19 DIAGNOSIS — R079 Chest pain, unspecified: Secondary | ICD-10-CM | POA: Insufficient documentation

## 2015-08-19 DIAGNOSIS — Z79891 Long term (current) use of opiate analgesic: Secondary | ICD-10-CM | POA: Diagnosis not present

## 2015-08-19 DIAGNOSIS — R0600 Dyspnea, unspecified: Secondary | ICD-10-CM

## 2015-08-19 LAB — BASIC METABOLIC PANEL
ANION GAP: 8 (ref 5–15)
BUN: 12 mg/dL (ref 6–20)
CHLORIDE: 104 mmol/L (ref 101–111)
CO2: 26 mmol/L (ref 22–32)
Calcium: 9.1 mg/dL (ref 8.9–10.3)
Creatinine, Ser: 0.69 mg/dL (ref 0.44–1.00)
GFR calc Af Amer: >60 mL/min (ref >60)
GFR calc non Af Amer: >60 mL/min (ref >60)
GLUCOSE: 96 mg/dL (ref 65–99)
POTASSIUM: 3.7 mmol/L (ref 3.5–5.1)
Sodium: 138 mmol/L (ref 135–145)

## 2015-08-19 LAB — CBC WITH DIFFERENTIAL/PLATELET
Basophils Absolute: 0 10*3/uL (ref 0.0–0.1)
Basophils Relative: 0 %
Eosinophils Absolute: 0.1 10*3/uL (ref 0.0–0.7)
Eosinophils Relative: 1 %
HEMATOCRIT: 36 % (ref 36.0–46.0)
HEMOGLOBIN: 11.9 g/dL — AB (ref 12.0–15.0)
LYMPHS ABS: 1.3 10*3/uL (ref 0.7–4.0)
LYMPHS PCT: 17 %
MCH: 30.3 pg (ref 26.0–34.0)
MCHC: 33.1 g/dL (ref 30.0–36.0)
MCV: 91.6 fL (ref 78.0–100.0)
Monocytes Absolute: 0.4 10*3/uL (ref 0.1–1.0)
Monocytes Relative: 5 %
NEUTROS ABS: 6.2 10*3/uL (ref 1.7–7.7)
NEUTROS PCT: 77 %
Platelets: 371 10*3/uL (ref 150–400)
RBC: 3.93 MIL/uL (ref 3.87–5.11)
RDW: 12.8 % (ref 11.5–15.5)
WBC: 8 10*3/uL (ref 4.0–10.5)

## 2015-08-19 LAB — D-DIMER, QUANTITATIVE: D-Dimer, Quant: 2.92 ug/mL-FEU — ABNORMAL HIGH (ref 0.00–0.50)

## 2015-08-19 MED ORDER — IOPAMIDOL (ISOVUE-370) INJECTION 76%
100.0000 mL | Freq: Once | INTRAVENOUS | Status: AC | PRN
  Administered 2015-08-20: 100 mL via INTRAVENOUS

## 2015-08-19 NOTE — ED Notes (Signed)
Dr Cook at bedside,  

## 2015-08-19 NOTE — ED Provider Notes (Signed)
CSN: [id number]     Arrival date & time 08/19/15  2124 History  By signing my name below, I, Linna Darner, attest that this documentation has been prepared under the direction and in the presence of physician practitioner, Donnetta Hutching, MD. Electronically Signed: Linna Darner, Scribe. 08/19/2015. 10:17 PM.    Chief Complaint  Patient presents with  . Chest Pain    The history is provided by the patient. No language interpreter was used.     HPI Comments: Brianna Russell is a 25 y.o. female who presents to the Emergency Department complaining of sudden onset, constant, worsening, right upper chest/shoulder pain beginning this morning around 6AM. Pt endorses associated SOB since onset as well. She states that her pain radiates down the right side of her torso. Pt notes chest pain exacerbation with deep inhalation. Pt had a C-section on May 26th of this year. She reports that she has not been immobile at home since her C-section. She denies cough, numbness, weakness, or any other associated symptoms.  Past Medical History  Diagnosis Date  . Hx of varicella   . Anxiety   . Depression    Past Surgical History  Procedure Laterality Date  . Cholecystectomy    . Cesarean section    . Cesarean section N/A 08/05/2015    Procedure: REPEAT CESAREAN SECTION;  Surgeon: Richarda Overlie, MD;  Location: [facility] BIRTHING SUITES;  Service: Obstetrics;  Laterality: N/A;  [facility] T to RNFA - confirmed edc 08/23/15     Family History  Problem Relation Age of Onset  . Diabetes Father   . Stroke Father   . Hypertension Father   . Heart disease Father   . Cancer Paternal Aunt   . Cancer Paternal Grandmother   . Cancer Paternal Grandfather    Social History  Substance Use Topics  . Smoking status: Former Smoker    Types: Cigarettes    Quit date: 08/05/2007  . Smokeless tobacco: None  . Alcohol Use: No   OB History    Gravida Para Term Preterm AB TAB SAB Ectopic Multiple Living   0 2      Review of Systems  A complete 10 system review of systems was obtained and all systems are negative except as noted in the HPI and PMH.   Allergies  Ibuprofen  Home Medications   Prior to Admission medications   Medication Sig Start Date End Date Taking? Authorizing Provider  acetaminophen (TYLENOL) 500 MG tablet Take 500 mg by mouth every 6 (six) hours as needed for mild pain or moderate pain.   Yes Historical Provider, MD  oxyCODONE (ROXICODONE) 5 MG/5ML solution Take 5 mLs (5 mg total) by mouth every 4 (four) hours as needed for severe pain (For pain scale 4-7). 08/07/15  Yes Richarda Overlie, MD  ibuprofen (ADVIL,MOTRIN) 100 MG/5ML suspension Take 40 mLs (800 mg total) by mouth every 8 (eight) hours as needed for moderate pain. Patient not taking: Reported on 08/19/2015 08/07/15   Richarda Overlie, MD   BP 115/65 mmHg  Pulse 102  Temp(Src) 99.2 F (37.3 C) (Oral)  Resp 20  Ht  (1.626 m)  Wt 256 lb (116.121 kg)  BMI 43.92 kg/m2  SpO2 98%  LMP 11/16/2014  Breastfeeding? No Physical Exam  Constitutional: She is oriented to person, place, and time. She appears well-developed and well-nourished.  Obese.  HENT:  Head: Normocephalic and atraumatic.  Eyes: Conjunctivae and EOM are normal. Pupils  are equal, round, and reactive to light.  Neck: Normal range of motion. Neck supple.  Cardiovascular: Normal rate and regular rhythm.   Pulmonary/Chest: Effort normal and breath sounds normal.  Abdominal: Soft. Bowel sounds are normal.  Musculoskeletal: Normal range of motion.  Neurological: She is alert and oriented to person, place, and time.  Skin: Skin is warm and dry.  Psychiatric: She has a normal mood and affect. Her behavior is normal.  Nursing note and vitals reviewed.   ED Course  Procedures (including critical care time)  DIAGNOSTIC STUDIES: Oxygen Saturation is 100% on RA, normal by my interpretation.    COORDINATION OF CARE: 10:17 PM Will order DG Chest 2 View.  Will order CBC, BMP, and D-dimer. Will order ED EKG. Discussed treatment plan with pt at bedside and pt agreed to plan.  Results for orders placed or performed during the hospital encounter of 08/19/15  CBC with Differential  Result Value Ref Range   WBC 8.0 4.0 - 10.5 K/uL   RBC 3.93 3.87 - 5.11 MIL/uL   Hemoglobin 11.9 (L) 12.0 - 15.0 g/dL   HCT 16.1 09.6 - 04.5 %   MCV 91.6 78.0 - 100.0 fL   MCH 30.3 26.0 - 34.0 pg   MCHC 33.1 30.0 - 36.0 g/dL   RDW 40.9 81.1 - 91.4 %   Platelets 371 150 - 400 K/uL   Neutrophils Relative % 77 %   Neutro Abs 6.2 1.7 - 7.7 K/uL   Lymphocytes Relative 17 %   Lymphs Abs 1.3 0.7 - 4.0 K/uL   Monocytes Relative 5 %   Monocytes Absolute 0.4 0.1 - 1.0 K/uL   Eosinophils Relative 1 %   Eosinophils Absolute 0.1 0.0 - 0.7 K/uL   Basophils Relative 0 %   Basophils Absolute 0.0 0.0 - 0.1 K/uL  Basic metabolic panel  Result Value Ref Range   Sodium 138 135 - 145 mmol/L   Potassium 3.7 3.5 - 5.1 mmol/L   Chloride 104 101 - 111 mmol/L   CO2 26 22 - 32 mmol/L   Glucose, Bld 96 65 - 99 mg/dL   BUN 12 6 - 20 mg/dL   Creatinine, Ser 7.82 0.44 - 1.00 mg/dL   Calcium 9.1 8.9 - 95.6 mg/dL   GFR calc non Af Amer >60 >60 mL/min   GFR calc Af Amer >60 >60 mL/min   Anion gap 8 5 - 15  D-dimer, quantitative (not at [facility])  Result Value Ref Range   D-Dimer, Quant 2.92 (H) 0.00 - 0.50 ug/mL-FEU   Dg Chest 2 View  08/19/2015  CLINICAL DATA:  Acute onset of right-sided chest pain. Initial encounter. EXAM: CHEST  2 VIEW COMPARISON:  None. FINDINGS: The lungs are well-aerated. Mild peribronchial thickening is noted. There is no evidence of focal opacification, pleural effusion or pneumothorax. The heart is normal in size; the mediastinal contour is within normal limits. No acute osseous abnormalities are seen. IMPRESSION: Mild peribronchial thickening noted.  Lungs otherwise clear. Electronically Signed   By: Roanna Raider M.D.   On: 08/19/2015 23:12    MDM   Final  diagnoses:  Dyspnea  Chest pain, unspecified chest pain type   Patient presents with dyspnea and right-sided chest pain. She is status post recent cesarean section. Pulse ox normal. D-dimer elevated. CT angiogram of chest pending.  Discussed with Dr. Patria Mane  I personally performed the services described in this documentation, which was scribed in my presence. The recorded information has been reviewed and  is accurate.    Donnetta HutchingBrian Tc Kapusta, MD 08/20/15 Perlie Mayo0020

## 2015-08-19 NOTE — ED Notes (Signed)
Pt reports right upper chest/shoulder pain that radiates down her right side. Pt reports some sob today. Pt recently had a c-section x 2 weeks.

## 2015-08-20 MED ORDER — KETOROLAC TROMETHAMINE 30 MG/ML IJ SOLN
30.0000 mg | Freq: Once | INTRAMUSCULAR | Status: AC
  Administered 2015-08-20: 30 mg via INTRAVENOUS
  Filled 2015-08-20: qty 1

## 2015-08-20 MED ORDER — FENTANYL CITRATE (PF) 100 MCG/2ML IJ SOLN
100.0000 ug | Freq: Once | INTRAMUSCULAR | Status: AC
  Administered 2015-08-20: 100 ug via INTRAVENOUS
  Filled 2015-08-20: qty 2

## 2015-08-20 NOTE — Discharge Instructions (Signed)
Nonspecific Chest Pain  °Chest pain can be caused by many different conditions. There is always a chance that your pain could be related to something serious, such as a heart attack or a blood clot in your lungs. Chest pain can also be caused by conditions that are not life-threatening. If you have chest pain, it is very important to follow up with your health care provider. °CAUSES  °Chest pain can be caused by: °· Heartburn. °· Pneumonia or bronchitis. °· Anxiety or stress. °· Inflammation around your heart (pericarditis) or lung (pleuritis or pleurisy). °· A blood clot in your lung. °· A collapsed lung (pneumothorax). It can develop suddenly on its own (spontaneous pneumothorax) or from trauma to the chest. °· Shingles infection (varicella-zoster virus). °· Heart attack. °· Damage to the bones, muscles, and cartilage that make up your chest wall. This can include: °¨ Bruised bones due to injury. °¨ Strained muscles or cartilage due to frequent or repeated coughing or overwork. °¨ Fracture to one or more ribs. °¨ Sore cartilage due to inflammation (costochondritis). °RISK FACTORS  °Risk factors for chest pain may include: °· Activities that increase your risk for trauma or injury to your chest. °· Respiratory infections or conditions that cause frequent coughing. °· Medical conditions or overeating that can cause heartburn. °· Heart disease or family history of heart disease. °· Conditions or health behaviors that increase your risk of developing a blood clot. °· Having had chicken pox (varicella zoster). °SIGNS AND SYMPTOMS °Chest pain can feel like: °· Burning or tingling on the surface of your chest or deep in your chest. °· Crushing, pressure, aching, or squeezing pain. °· Dull or sharp pain that is worse when you move, cough, or take a deep breath. °· Pain that is also felt in your back, neck, shoulder, or arm, or pain that spreads to any of these areas. °Your chest pain may come and go, or it may stay  constant. °DIAGNOSIS °Lab tests or other studies may be needed to find the cause of your pain. Your health care provider may have you take a test called an ambulatory ECG (electrocardiogram). An ECG records your heartbeat patterns at the time the test is performed. You may also have other tests, such as: °· Transthoracic echocardiogram (TTE). During echocardiography, sound waves are used to create a picture of all of the heart structures and to look at how blood flows through your heart. °· Transesophageal echocardiogram (TEE). This is a more advanced imaging test that obtains images from inside your body. It allows your health care provider to see your heart in finer detail. °· Cardiac monitoring. This allows your health care provider to monitor your heart rate and rhythm in real time. °· Holter monitor. This is a portable device that records your heartbeat and can help to diagnose abnormal heartbeats. It allows your health care provider to track your heart activity for several days, if needed. °· Stress tests. These can be done through exercise or by taking medicine that makes your heart beat more quickly. °· Blood tests. °· Imaging tests. °TREATMENT  °Your treatment depends on what is causing your chest pain. Treatment may include: °· Medicines. These may include: °¨ Acid blockers for heartburn. °¨ Anti-inflammatory medicine. °¨ Pain medicine for inflammatory conditions. °¨ Antibiotic medicine, if an infection is present. °¨ Medicines to dissolve blood clots. °¨ Medicines to treat coronary artery disease. °· Supportive care for conditions that do not require medicines. This may include: °¨ Resting. °¨ Applying heat   or cold packs to injured areas. °¨ Limiting activities until pain decreases. °HOME CARE INSTRUCTIONS °· If you were prescribed an antibiotic medicine, finish it all even if you start to feel better. °· Avoid any activities that bring on chest pain. °· Do not use any tobacco products, including  cigarettes, chewing tobacco, or electronic cigarettes. If you need help quitting, ask your health care provider. °· Do not drink alcohol. °· Take medicines only as directed by your health care provider. °· Keep all follow-up visits as directed by your health care provider. This is important. This includes any further testing if your chest pain does not go away. °· If heartburn is the cause for your chest pain, you may be told to keep your head raised (elevated) while sleeping. This reduces the chance that acid will go from your stomach into your esophagus. °· Make lifestyle changes as directed by your health care provider. These may include: °¨ Getting regular exercise. Ask your health care provider to suggest some activities that are safe for you. °¨ Eating a heart-healthy diet. A registered dietitian can help you to learn healthy eating options. °¨ Maintaining a healthy weight. °¨ Managing diabetes, if necessary. °¨ Reducing stress. °SEEK MEDICAL CARE IF: °· Your chest pain does not go away after treatment. °· You have a rash with blisters on your chest. °· You have a fever. °SEEK IMMEDIATE MEDICAL CARE IF:  °· Your chest pain is worse. °· You have an increasing cough, or you cough up blood. °· You have severe abdominal pain. °· You have severe weakness. °· You faint. °· You have chills. °· You have sudden, unexplained chest discomfort. °· You have sudden, unexplained discomfort in your arms, back, neck, or jaw. °· You have shortness of breath at any time. °· You suddenly start to sweat, or your skin gets clammy. °· You feel nauseous or you vomit. °· You suddenly feel light-headed or dizzy. °· Your heart begins to beat quickly, or it feels like it is skipping beats. °These symptoms may represent a serious problem that is an emergency. Do not wait to see if the symptoms will go away. Get medical help right away. Call your local emergency services (911 in the [place] Do not drive yourself to the hospital. °  °This  information is not intended to replace advice given to you by your health care provider. Make sure you discuss any questions you have with your health care provider. °  °Document Released: 12/06/2004 Document Revised: 03/19/2014 Document Reviewed: 10/02/2013 °Elsevier Interactive Patient Education ©2016 [employer] ° °

## 2015-08-20 NOTE — ED Provider Notes (Signed)
Atypical chest pain.  CT negative for pulmonary embolism.  May represent pleurisy.  Home with anti-inflammatories and primary care follow-up.  She understands to return to the ER for new or worsening symptoms.  No signs pneumonia.  Doubt ACS.  I personally reviewed the imaging tests through PACS system I reviewed available ER/hospitalization records through the EMR  Dg Chest 2 View  08/19/2015  CLINICAL DATA:  Acute onset of right-sided chest pain. Initial encounter. EXAM: CHEST  2 VIEW COMPARISON:  None. FINDINGS: The lungs are well-aerated. Mild peribronchial thickening is noted. There is no evidence of focal opacification, pleural effusion or pneumothorax. The heart is normal in size; the mediastinal contour is within normal limits. No acute osseous abnormalities are seen. IMPRESSION: Mild peribronchial thickening noted.  Lungs otherwise clear. Electronically Signed   By: Roanna RaiderJeffery  Chang M.D.   On: 08/19/2015 23:12   Ct Angio Chest Pe W/cm &/or Wo Cm  08/20/2015  CLINICAL DATA:  Acute onset of right-sided chest and abdominal pain. Initial encounter. EXAM: CT ANGIOGRAPHY CHEST WITH CONTRAST TECHNIQUE: Multidetector CT imaging of the chest was performed using the standard protocol during bolus administration of intravenous contrast. Multiplanar CT image reconstructions and MIPs were obtained to evaluate the vascular anatomy. CONTRAST:  100 mL of Isovue 370 IV contrast COMPARISON:  Chest radiograph performed 08/19/2015 FINDINGS: There is no evidence of pulmonary embolus. Minimal bibasilar atelectasis is noted. The lungs are otherwise clear. There is no evidence of significant focal consolidation, pleural effusion or pneumothorax. No masses are identified; no abnormal focal contrast enhancement is seen. The mediastinum is unremarkable in appearance. No mediastinal lymphadenopathy is seen. No pericardial effusion is identified. The great vessels are grossly unremarkable in appearance. No axillary  lymphadenopathy is seen. The visualized portions of the thyroid gland are unremarkable in appearance. The visualized portions of the liver and spleen are unremarkable. The visualized portions of the pancreas, adrenal glands and left kidney are grossly unremarkable. No acute osseous abnormalities are seen. Review of the MIP images confirms the above findings. IMPRESSION: 1. No evidence of pulmonary embolus. 2. Minimal bibasilar atelectasis noted.  Lungs otherwise clear. Electronically Signed   By: Roanna RaiderJeffery  Chang M.D.   On: 08/20/2015 01:31     Azalia BilisKevin Nicolina Hirt, MD 08/20/15 [phone]

## 2015-08-20 NOTE — ED Notes (Signed)
Pt c/o sob and pain that has gotten worse, pulse ox 100%ra,  Attempts made to reposition pt to reduce pain without success, Dr Patria Maneampos notified, additional orders received,

## 2015-08-20 NOTE — ED Notes (Signed)
Pt updated on plan of care, family remains at bedside,  

## 2015-09-03 ENCOUNTER — Other Ambulatory Visit: Payer: Self-pay

## 2015-09-03 ENCOUNTER — Encounter (HOSPITAL_COMMUNITY): Payer: Self-pay

## 2015-09-03 ENCOUNTER — Inpatient Hospital Stay (HOSPITAL_COMMUNITY)
Admission: EM | Admit: 2015-09-03 | Discharge: 2015-09-04 | DRG: 176 | Disposition: A | Discharge status: Home / Self Care | Payer: 59 | Attending: Internal Medicine | Admitting: Internal Medicine

## 2015-09-03 ENCOUNTER — Emergency Department (HOSPITAL_COMMUNITY): Payer: 59

## 2015-09-03 DIAGNOSIS — O24419 Gestational diabetes mellitus in pregnancy, unspecified control: Secondary | ICD-10-CM | POA: Diagnosis present

## 2015-09-03 DIAGNOSIS — D649 Anemia, unspecified: Secondary | ICD-10-CM | POA: Diagnosis present

## 2015-09-03 DIAGNOSIS — Z833 Family history of diabetes mellitus: Secondary | ICD-10-CM

## 2015-09-03 DIAGNOSIS — I2699 Other pulmonary embolism without acute cor pulmonale: Secondary | ICD-10-CM | POA: Diagnosis present

## 2015-09-03 DIAGNOSIS — R079 Chest pain, unspecified: Secondary | ICD-10-CM

## 2015-09-03 DIAGNOSIS — R0781 Pleurodynia: Secondary | ICD-10-CM | POA: Diagnosis not present

## 2015-09-03 DIAGNOSIS — Z8249 Family history of ischemic heart disease and other diseases of the circulatory system: Secondary | ICD-10-CM | POA: Diagnosis not present

## 2015-09-03 DIAGNOSIS — R001 Bradycardia, unspecified: Secondary | ICD-10-CM | POA: Diagnosis present

## 2015-09-03 DIAGNOSIS — Z3490 Encounter for supervision of normal pregnancy, unspecified, unspecified trimester: Secondary | ICD-10-CM

## 2015-09-03 DIAGNOSIS — R071 Chest pain on breathing: Secondary | ICD-10-CM | POA: Diagnosis present

## 2015-09-03 DIAGNOSIS — Z79899 Other long term (current) drug therapy: Secondary | ICD-10-CM | POA: Diagnosis not present

## 2015-09-03 DIAGNOSIS — F329 Major depressive disorder, single episode, unspecified: Secondary | ICD-10-CM | POA: Diagnosis not present

## 2015-09-03 DIAGNOSIS — Z888 Allergy status to other drugs, medicaments and biological substances status: Secondary | ICD-10-CM | POA: Diagnosis not present

## 2015-09-03 DIAGNOSIS — Z87891 Personal history of nicotine dependence: Secondary | ICD-10-CM | POA: Diagnosis not present

## 2015-09-03 DIAGNOSIS — Z6841 Body Mass Index (BMI) 40.0 and over, adult: Secondary | ICD-10-CM | POA: Diagnosis not present

## 2015-09-03 DIAGNOSIS — N939 Abnormal uterine and vaginal bleeding, unspecified: Secondary | ICD-10-CM | POA: Diagnosis present

## 2015-09-03 DIAGNOSIS — R110 Nausea: Secondary | ICD-10-CM | POA: Diagnosis not present

## 2015-09-03 DIAGNOSIS — Z7984 Long term (current) use of oral hypoglycemic drugs: Secondary | ICD-10-CM | POA: Diagnosis not present

## 2015-09-03 HISTORY — DX: Gestational diabetes mellitus in pregnancy, unspecified control: O24.419

## 2015-09-03 HISTORY — DX: Other pulmonary embolism without acute cor pulmonale: I26.99

## 2015-09-03 HISTORY — DX: Pleurisy: R09.1

## 2015-09-03 LAB — BASIC METABOLIC PANEL
ANION GAP: 6 (ref 5–15)
BUN: 12 mg/dL (ref 6–20)
CALCIUM: 9.3 mg/dL (ref 8.9–10.3)
CO2: 26 mmol/L (ref 22–32)
Chloride: 104 mmol/L (ref 101–111)
Creatinine, Ser: 0.77 mg/dL (ref 0.44–1.00)
GFR calc non Af Amer: >60 mL/min (ref >60)
GLUCOSE: 135 mg/dL — AB (ref 65–99)
POTASSIUM: 4.1 mmol/L (ref 3.5–5.1)
Sodium: 136 mmol/L (ref 135–145)

## 2015-09-03 LAB — CBC WITH DIFFERENTIAL/PLATELET
BASOS ABS: 0 10*3/uL (ref 0.0–0.1)
BASOS PCT: 0 %
Eosinophils Absolute: 0.1 10*3/uL (ref 0.0–0.7)
Eosinophils Relative: 1 %
HEMATOCRIT: 35.3 % — AB (ref 36.0–46.0)
Hemoglobin: 11.5 g/dL — ABNORMAL LOW (ref 12.0–15.0)
LYMPHS PCT: 11 %
Lymphs Abs: 0.9 10*3/uL (ref 0.7–4.0)
MCH: 29.7 pg (ref 26.0–34.0)
MCHC: 32.6 g/dL (ref 30.0–36.0)
MCV: 91.2 fL (ref 78.0–100.0)
MONO ABS: 0.5 10*3/uL (ref 0.1–1.0)
MONOS PCT: 6 %
NEUTROS ABS: 6.9 10*3/uL (ref 1.7–7.7)
NEUTROS PCT: 82 %
Platelets: 250 10*3/uL (ref 150–400)
RBC: 3.87 MIL/uL (ref 3.87–5.11)
RDW: 13.5 % (ref 11.5–15.5)
WBC: 8.4 10*3/uL (ref 4.0–10.5)

## 2015-09-03 LAB — D-DIMER, QUANTITATIVE (NOT AT ARMC): D DIMER QUANT: 2.79 ug{FEU}/mL — AB (ref 0.00–0.50)

## 2015-09-03 LAB — ANTITHROMBIN III: AntiThromb III Func: 83 % (ref 75–120)

## 2015-09-03 LAB — TROPONIN I: Troponin I: <0.03 ng/mL (ref <0.031)

## 2015-09-03 MED ORDER — ENOXAPARIN SODIUM 120 MG/0.8ML ~~LOC~~ SOLN
1.0000 mg/kg | Freq: Once | SUBCUTANEOUS | Status: AC
  Administered 2015-09-03: 115 mg via SUBCUTANEOUS
  Filled 2015-09-03: qty 0.8

## 2015-09-03 MED ORDER — MORPHINE SULFATE (PF) 4 MG/ML IV SOLN: 4.0000 mg | INTRAVENOUS | Status: DC | PRN

## 2015-09-03 MED ORDER — POLYETHYLENE GLYCOL 3350 17 G PO PACK: 17.0000 g | PACK | Freq: Every day | ORAL | Status: DC | PRN

## 2015-09-03 MED ORDER — IOPAMIDOL (ISOVUE-370) INJECTION 76%
100.0000 mL | Freq: Once | INTRAVENOUS | Status: AC | PRN
  Administered 2015-09-03: 100 mL via INTRAVENOUS

## 2015-09-03 MED ORDER — ONDANSETRON HCL 4 MG/2ML IJ SOLN: 4.0000 mg | Freq: Four times a day (QID) | INTRAMUSCULAR | Status: DC | PRN

## 2015-09-03 MED ORDER — OXYCODONE HCL 5 MG PO TABS
5.0000 mg | ORAL_TABLET | ORAL | Status: DC | PRN
  Administered 2015-09-03: 5 mg via ORAL
  Filled 2015-09-03: qty 1

## 2015-09-03 MED ORDER — KETOROLAC TROMETHAMINE 30 MG/ML IJ SOLN
30.0000 mg | Freq: Once | INTRAMUSCULAR | Status: AC
  Administered 2015-09-03: 30 mg via INTRAVENOUS
  Filled 2015-09-03: qty 1

## 2015-09-03 MED ORDER — ALBUTEROL SULFATE (2.5 MG/3ML) 0.083% IN NEBU: 2.5000 mg | INHALATION_SOLUTION | RESPIRATORY_TRACT | Status: DC | PRN

## 2015-09-03 MED ORDER — ONDANSETRON HCL 4 MG/2ML IJ SOLN
4.0000 mg | Freq: Once | INTRAMUSCULAR | Status: AC
  Administered 2015-09-03: 4 mg via INTRAVENOUS
  Filled 2015-09-03: qty 2

## 2015-09-03 MED ORDER — ACETAMINOPHEN 325 MG PO TABS: 650.0000 mg | ORAL_TABLET | Freq: Four times a day (QID) | ORAL | Status: DC | PRN

## 2015-09-03 MED ORDER — SODIUM CHLORIDE 0.9 % IV SOLN
INTRAVENOUS | Status: DC
  Administered 2015-09-03: 1000 mL via INTRAVENOUS

## 2015-09-03 MED ORDER — ONDANSETRON HCL 4 MG PO TABS: 4.0000 mg | ORAL_TABLET | Freq: Four times a day (QID) | ORAL | Status: DC | PRN

## 2015-09-03 MED ORDER — ENOXAPARIN SODIUM 120 MG/0.8ML ~~LOC~~ SOLN
1.0000 mg/kg | Freq: Two times a day (BID) | SUBCUTANEOUS | Status: DC
  Administered 2015-09-03 (×2): 115 mg via SUBCUTANEOUS
  Filled 2015-09-03 (×5): qty 0.8

## 2015-09-03 MED ORDER — POTASSIUM CHLORIDE IN NACL 20-0.9 MEQ/L-% IV SOLN
INTRAVENOUS | Status: DC
  Administered 2015-09-03: 14:00:00 via INTRAVENOUS

## 2015-09-03 MED ORDER — DEXAMETHASONE SODIUM PHOSPHATE 4 MG/ML IJ SOLN
10.0000 mg | Freq: Once | INTRAMUSCULAR | Status: AC
  Administered 2015-09-03: 10 mg via INTRAVENOUS
  Filled 2015-09-03: qty 3

## 2015-09-03 MED ORDER — ACETAMINOPHEN 650 MG RE SUPP: 650.0000 mg | Freq: Four times a day (QID) | RECTAL | Status: DC | PRN

## 2015-09-03 MED ORDER — CYCLOBENZAPRINE HCL 10 MG PO TABS
10.0000 mg | ORAL_TABLET | Freq: Once | ORAL | Status: AC
  Administered 2015-09-03: 10 mg via ORAL
  Filled 2015-09-03: qty 1

## 2015-09-03 NOTE — ED Provider Notes (Signed)
CT chest positive for PE Pt stable No hypoxia D/w dr Vincente Poligrewal, on call for OBGYN She is safe for anticoagulation (pt without any recent bleeding episodes s/p c-section) Pt is NOT breastfeeding D/w dr Sherrie Mustachefisher Admit to tele Start lovenox 1mg /kg   Zadie Rhineonald Jem Castro, MD 09/03/15 [phone]

## 2015-09-03 NOTE — ED Notes (Signed)
Pt to the nurses station asking for something for pain. Pt informed she has to wait for the EDP to see her first. Pt returned to room.

## 2015-09-03 NOTE — ED Notes (Signed)
Pt states was seen in the ER & dx w/ pleurisy. Pain got worse tonight. Hurts when she moves or takes a deep breath. Pt says to ibuprofen was taken at 2100 yesterday w/ no relief.

## 2015-09-03 NOTE — Progress Notes (Addendum)
ANTICOAGULATION CONSULT NOTE - Initial Consult  Pharmacy Consult for Lovenox Indication: pulmonary embolus  Allergies  Allergen Reactions  . Ibuprofen Nausea Only    Headache     Patient Measurements: Height: 5\' 4"  (162.6 cm) Weight: 248 lb (112.492 kg) IBW/kg (Calculated) : 54.7   Vital Signs: Temp: 98.8 F (37.1 C) (06/24 1009) Temp Source: Oral (06/24 1009) BP: 103/60 mmHg (06/24 1009) Pulse Rate: 89 (06/24 1009)  Labs:  Recent Labs  09/03/15 0620  HGB 11.5*  HCT 35.3*  PLT 250  CREATININE 0.77  TROPONINI <0.03    Estimated Creatinine Clearance: 130.9 mL/min (by C-G formula based on Cr of 0.77).   Medical History: Past Medical History  Diagnosis Date  . Hx of varicella   . Anxiety   . Depression   . Pleurisy     Medications:  Prescriptions prior to admission  Medication Sig Dispense Refill Last Dose  . ibuprofen (ADVIL,MOTRIN) 100 MG/5ML suspension Take 40 mLs (800 mg total) by mouth every 8 (eight) hours as needed for moderate pain. 1000 mL 1 Past Week at Unknown time  . acetaminophen (TYLENOL) 500 MG tablet Take 500 mg by mouth every 6 (six) hours as needed for mild pain or moderate pain.   08/19/2015 at Unknown time  . oxyCODONE (ROXICODONE) 5 MG/5ML solution Take 5 mLs (5 mg total) by mouth every 4 (four) hours as needed for severe pain (For pain scale 4-7). (Patient not taking: Reported on 09/03/2015) 100 mL 0 Past Week at Unknown time    Assessment: 25 yo lady s/p C-section to start lovenox for PE Goal of Therapy:  Anti-Xa level 0.6-1 units/ml 4hrs after LMWH dose given Monitor platelets by anticoagulation protocol: Yes   Plan:  Lovenox 115 mg sq q12 hours F/u CBC.  Monitor for bleeding complications  [place] Dube Poteet 09/03/2015,11:54 AM  Addum:  Change to eliquis 10 mg po bid x 7 days then 5 mg po bid Eliquis education.

## 2015-09-03 NOTE — ED Notes (Signed)
Pt states she was here a week ago and diagnosed with pleurisy, states she is not feeling any better.  Pain is on the right anterior chest and goes around to the right side.

## 2015-09-03 NOTE — H&P (Addendum)
History and Physical    Brianna Russell AVW:[id number] DOB: 1991-02-11 DOA: 09/03/2015  PCP: Louie BostonAPPER,DAVID B, MD and OB/GYN-Dr. Vincente PoliGrewal  Patient coming from: Home  Chief Complaint: Shortness of breath and right-sided pleuritic chest pain  HPI: Brianna Russell is a 25 y.o. female with medical history significant for a recent pregnancy/delivery with C-section on 08/05/2015, obesity, and depression, who presents to the ED with a chief complaint of shortness of breath and right-sided chest pain. She has presented to the ED on 08/19/2015 with the same or similar symptoms. Chest CT angiogram at that time did not reveal a PE. She was treated with ibuprofen and sent home. Since that time, she has had increasing right-sided chest pain, pleuritic in nature. She has had shortness of breath with activity and at rest. Ibuprofen relieves some of the pain temporarily. Movement and deep breathing make the pain worse. She denies cough with sputum, fever, chills, headache, lightheadedness, nausea, vomiting, or diarrhea. She still has some residual suprapubic pain from her C-section and she also reports taking an antibiotic for 5 days for an incisional infection a couple of weeks ago. She denies swelling in her legs. She denies any recent travel. She denies any known history of family blood clots. She does not smoke; she quit several years ago.  ED Course: In the ED, she was afebrile and hemodynamically stable. She was oxygenating in the upper 90s on room air. Her lab data were significant for an elevated d-dimer of 2.79, normal troponin I, and hemoglobin of 11.5. CT angiogram of the chest revealed acute subsegmental pulmonary emboli in the bilateral lower lobes; no CT evidence of pulmonary hypertension or right heart strain; trace right pleural effusion; mosaic attenuation in the lower lobes, a nonspecific finding. She is being admitted for evaluation and management of PE.  Of note, EDP, Dr. Bebe ShaggyWickline discussed the patient with  her GYN, Dr. Vincente PoliGrewal who stated it was safe for her to be anticoagulated as the patient had no recent bleeding episodes following the C-section. And she is not breast-feeding.  Review of Systems: As per HPI otherwise 10 point review of systems negative; with exception of mild suprapubic tenderness status post C-section.    Past Medical History  Diagnosis Date  . Hx of varicella   . Anxiety   . Depression   . Pleurisy     Past Surgical History  Procedure Laterality Date  . Cholecystectomy    . Cesarean section    . Cesarean section N/A 08/05/2015    Procedure: REPEAT CESAREAN SECTION;  Surgeon: Richarda Overlieichard Holland, MD;  [facility];  Service: Obstetrics;  Laterality: N/A;  Kennith Centerracey T to RNFA - confirmed edc 08/23/15      Social history: She is married. She has 2 children. She is employed. She denies tobacco, alcohol, and illicit drug use. She reports that she quit smoking about 8 years ago. Her smoking use included Cigarettes.   Allergies  Allergen Reactions  . Ibuprofen Nausea Only    Headache     Family History  Problem Relation Age of Onset  . Diabetes Father   . Stroke Father   . Hypertension Father   . Heart disease Father   . Cancer Paternal Aunt   . Cancer Paternal Grandmother   . Cancer Paternal Grandfather      Prior to Admission medications   Medication Sig Start Date End Date Taking? Authorizing Provider  ibuprofen (ADVIL,MOTRIN) 100 MG/5ML suspension Take 40 mLs (800 mg total) by mouth  every 8 (eight) hours as needed for moderate pain. 08/07/15  Yes Richarda Overlie, MD  acetaminophen (TYLENOL) 500 MG tablet Take 500 mg by mouth every 6 (six) hours as needed for mild pain or moderate pain.    Historical Provider, MD  oxyCODONE (ROXICODONE) 5 MG/5ML solution Take 5 mLs (5 mg total) by mouth every 4 (four) hours as needed for severe pain (For pain scale 4-7). Patient not taking: Reported on 09/03/2015 08/07/15   Richarda Overlie, MD    Physical  Exam: Filed Vitals:   09/03/15 0634 09/03/15 0700 09/03/15 0932 09/03/15 1009  BP: 100/71 99/69 115/72 103/60  Pulse: 101 91 82 89  Temp:    98.8 F (37.1 C)  TempSrc:    Oral  Resp: 19 17 16 18   Height:      Weight:      SpO2: 99% 98% 99% 98%      Constitutional: Obese 25 year old Caucasian woman in no acute distress. Filed Vitals:   09/03/15 0634 09/03/15 0700 09/03/15 0932 09/03/15 1009  BP: 100/71 99/69 115/72 103/60  Pulse: 101 91 82 89  Temp:    98.8 F (37.1 C)  TempSrc:    Oral  Resp: 19 17 16 18   Height:      Weight:      SpO2: 99% 98% 99% 98%   Eyes: PERRL, lids and conjunctivae normal ENMT: Mucous membranes are Mildly dry. Posterior pharynx clear of any exudate or lesions.Normal dentition.  Neck: normal, supple, no masses, no thyromegaly Respiratory: Clear to auscultation with decreased breath sounds in the bases; mild splinting, no wheezing, no crackles. Normal respiratory effort. No accessory muscle use.  Cardiovascular: Regular rate and rhythm, no murmurs / rubs / gallops. No extremity edema. 2+ pedal pulses. No carotid bruits.  Abdomen: Obese; mild suprapubic tenderness, no masses palpated; no distention or masses palpated. No hepatosplenomegaly. Bowel sounds positive.  Musculoskeletal: no clubbing / cyanosis. No joint deformity upper and lower extremities. Good ROM, no contractures. Normal muscle tone. No calf cords palpated. Skin: no rashes, lesions, ulcers. No induration Neurologic: CN 2-12 grossly intact. Sensation intact, DTR normal. Strength 5/5 in all 4.  Psychiatric: Normal judgment and insight. Alert and oriented x 3. Flat affect.     Labs on Admission: I have personally reviewed following labs and imaging studies  CBC:  Recent Labs Lab 09/03/15 0620  WBC 8.4  NEUTROABS 6.9  HGB 11.5*  HCT 35.3*  MCV 91.2  PLT 250   Basic Metabolic Panel:  Recent Labs Lab 09/03/15 0620  NA 136  K 4.1  CL 104  CO2 26  GLUCOSE 135*  BUN 12   CREATININE 0.77  CALCIUM 9.3   GFR: Estimated Creatinine Clearance: 130.9 mL/min (by C-G formula based on Cr of 0.77). Liver Function Tests: No results for input(s): AST, ALT, ALKPHOS, BILITOT, PROT, ALBUMIN in the last 168 hours. No results for input(s): LIPASE, AMYLASE in the last 168 hours. No results for input(s): AMMONIA in the last 168 hours. Coagulation Profile: No results for input(s): INR, PROTIME in the last 168 hours. Cardiac Enzymes:  Recent Labs Lab 09/03/15 0620  TROPONINI <0.03   BNP (last 3 results) No results for input(s): PROBNP in the last 8760 hours. HbA1C: No results for input(s): HGBA1C in the last 72 hours. CBG: No results for input(s): GLUCAP in the last 168 hours. Lipid Profile: No results for input(s): CHOL, HDL, LDLCALC, TRIG, CHOLHDL, LDLDIRECT in the last 72 hours. Thyroid Function Tests: No results for  input(s): TSH, T4TOTAL, FREET4, T3FREE, THYROIDAB in the last 72 hours. Anemia Panel: No results for input(s): VITAMINB12, FOLATE, FERRITIN, TIBC, IRON, RETICCTPCT in the last 72 hours. Urine analysis: No results found for: COLORURINE, APPEARANCEUR, LABSPEC, PHURINE, GLUCOSEU, HGBUR, BILIRUBINUR, KETONESUR, PROTEINUR, UROBILINOGEN, NITRITE, LEUKOCYTESUR Sepsis Labs: !!!!!!!!!!!!!!!!!!!!!!!!!!!!!!!!!!!!!!!!!!!! @LABRCNTIP (procalcitonin:4,lacticidven:4) )No results found for this or any previous visit (from the past 240 hour(s)).   Radiological Exams on Admission: Ct Angio Chest Pe W/cm &/or Wo Cm  09/03/2015  CLINICAL DATA:  Right pleuritic chest pain and shortness of breath. One month postpartum. EXAM: CT ANGIOGRAPHY CHEST WITH CONTRAST TECHNIQUE: Multidetector CT imaging of the chest was performed using the standard protocol during bolus administration of intravenous contrast. Multiplanar CT image reconstructions and MIPs were obtained to evaluate the vascular anatomy. CONTRAST:  100 cc Isovue 370 IV. COMPARISON:  08/20/2015 chest CT angiogram.  FINDINGS: Mediastinum/Nodes: The study is moderate quality for the evaluation of pulmonary embolism, with partial limitation by motion and limited bolus opacification of the pulmonary arteries. There are acute subsegmental pulmonary emboli in the bilateral lower lobes. No central, lobar or segmental pulmonary emboli. Great vessels are normal in course and caliber. Normal heart size. RV/LV ratio 0.81. No significant pericardial fluid/thickening. No discrete thyroid nodules. Unremarkable esophagus. No pathologically enlarged axillary, mediastinal or hilar lymph nodes. Lungs/Pleura: No pneumothorax. Trace layering right pleural effusion. No left pleural effusion. Subsegmental atelectasis at the right lung base. Mosaic attenuation in the lower lungs. Otherwise no acute consolidative airspace disease, significant pulmonary nodules or lung masses. Upper abdomen: Unremarkable. Musculoskeletal: No aggressive appearing focal osseous lesions. Mild thoracic spondylosis. Review of the MIP images confirms the above findings. IMPRESSION: 1. Acute subsegmental pulmonary emboli in the bilateral lower lobes. 2. No CT evidence of pulmonary hypertension or right heart strain. 3. Trace right pleural effusion. 4. Mosaic attenuation in the lower lungs, a nonspecific finding that could indicate mosaic perfusion due to the pulmonary embolism and/or air trapping due to small airways disease. 5. Subsegmental right lung base atelectasis. Critical Value/emergent results were called by telephone at the time of interpretation on 09/03/2015 at 8:03 am to Dr. Bebe ShaggyWICKLINE, who verbally acknowledged these results. Electronically Signed   By: Delbert PhenixJason A Poff M.D.   On: 09/03/2015 08:04    EKG: Independently reviewed. Sinus tachycardia with a heart rate of 101 bpm; no worrisome ST or T-wave abnormality.  Assessment/Plan Principal Problem:   Bilateral pulmonary embolism (HCC) Active Problems:   Pleuritic chest pain   Obesity, morbid (HCC)    Normocytic anemia   1. Bilateral PE. Interestingly, the patient presented to the ED on 08/19/2015 with pleuritic chest pain and shortness of breath. At that time, the CT angiogram of the chest revealed no PE. During this presentation, the CTA of the chest revealed subsegmental PE. She does not appear to be in extremis and she has not hypoxic. She denies any recent travel. She denies taking OC A's. She stopped smoking several years ago. She has had no bilateral lower extremity leg pain or swelling. -Although the likely risk factor was her pregnancy and being postpartum, it is reasonable to order a hypercoagulable panel for further investigation. -Lovenox was given in the ED, we'll continue Lovenox for 24-48 hours before transitioning it to an oral anticoagulant. -Provide supportive treatment with oxygen and analgesics as needed.  Normocytic anemia. Her hemoglobin is mildly low, likely from the recent residual blood loss from the C-section on 08/05/15.   DVT prophylaxis: Lovenox Code Status: Full code Family Communication: Discussed with parents  Disposition Plan: Discharge to home with clinically appropriate, likely in the next 24-48 hours. Consults called: None Admission status: Inpatient telemetry   [facility] MD [facility] Pager [phone]  If 7PM-7AM, please contact night-coverage [email or web address] Password [facility]  09/03/2015, 11:54 AM

## 2015-09-03 NOTE — ED Provider Notes (Signed)
CSN: [id number]     Arrival date & time 09/03/15  0409 History   First MD Initiated Contact with Patient 09/03/15 0550     Chief Complaint  Patient presents with  . Pleurisy     (Consider location/radiation/quality/duration/timing/severity/associated sxs/prior Treatment) HPI patient is G2 P2 Ab0, one month postpartum, who states yesterday morning about 6 AM she woke up and she had diffuse right-sided chest discomfort. She states it  initially was mild but it got progressively worse during the day and evening. She took ibuprofen at 9 PM without relief and also tried heat without relief. She states the pain is sharp. It hurts when she lays down or when she breathes deep. Nothing she does makes it feel better. She has had mild shortness of breath midday yesterday and she associates it with the pain. She states she feels the need to cough but she doesn't because it hurts. She denies any pain or swelling of her legs. Patient states she had similar pain about a week ago and  was seen in the ED and had a thorough evaluation including a CT angiogram of her chest which showed no PE. She states she did get better with ibuprofen and had 1 mild flareup and then was doing okay until yesterday. Her first pregnancy was complicated by an emergency C-section, acute appendicitis after delivery that was treated with IV antibiotics in the hospital for a week, followed by a cholecystectomy.  PCP Dr Margo Commonapper in [facility] [place]  Past Medical History  Diagnosis Date  . Hx of varicella   . Anxiety   . Depression   . Pleurisy    Past Surgical History  Procedure Laterality Date  . Cholecystectomy    . Cesarean section    . Cesarean section N/A 08/05/2015    Procedure: REPEAT CESAREAN SECTION;  Surgeon: Richarda Overlieichard Holland, MD;  [facility];  Service: Obstetrics;  Laterality: N/A;  Kennith Centerracey T to RNFA - confirmed edc 08/23/15     Family History  Problem Relation Age of Onset  .  Diabetes Father   . Stroke Father   . Hypertension Father   . Heart disease Father   . Cancer Paternal Aunt   . Cancer Paternal Grandmother   . Cancer Paternal Grandfather    Social History  Substance Use Topics  . Smoking status: Former Smoker    Types: Cigarettes    Quit date: 08/05/2007  . Smokeless tobacco: None  . Alcohol Use: No   employed  OB History    Gravida Para Term Preterm AB TAB SAB Ectopic Multiple Living   2 2 1       0 2     Review of Systems  All other systems reviewed and are negative.     Allergies  Ibuprofen  Home Medications   Prior to Admission medications   Medication Sig Start Date End Date Taking? Authorizing Provider  ibuprofen (ADVIL,MOTRIN) 100 MG/5ML suspension Take 40 mLs (800 mg total) by mouth every 8 (eight) hours as needed for moderate pain. 08/07/15  Yes Richarda Overlieichard Holland, MD  acetaminophen (TYLENOL) 500 MG tablet Take 500 mg by mouth every 6 (six) hours as needed for mild pain or moderate pain.    Historical Provider, MD  oxyCODONE (ROXICODONE) 5 MG/5ML solution Take 5 mLs (5 mg total) by mouth every 4 (four) hours as needed for severe pain (For pain scale 4-7). 08/07/15   Richarda Overlieichard Holland, MD   BP 109/68 mmHg  Pulse  96  Temp(Src) 98.6 F (37 C) (Oral)  Resp 18  Ht  (1.626 m)  Wt 248 lb (112.492 kg)  BMI 42.55 kg/m2  SpO2 100%  LMP 11/16/2014  Vital signs normal   Physical Exam  Constitutional: She is oriented to person, place, and time. She appears well-developed and well-nourished.  Non-toxic appearance. She does not appear ill. She appears distressed.  tearful  HENT:  Head: Normocephalic and atraumatic.  Right Ear: External ear normal.  Left Ear: External ear normal.  Nose: Nose normal. No mucosal edema or rhinorrhea.  Mouth/Throat: Oropharynx is clear and moist and mucous membranes are normal. No dental abscesses or uvula swelling.  Eyes: Conjunctivae and EOM are normal. Pupils are equal, round, and reactive to  light.  Neck: Normal range of motion and full passive range of motion without pain. Neck supple.  Cardiovascular: Regular rhythm and normal heart sounds.  Tachycardia present.  Exam reveals no gallop and no friction rub.   No murmur heard. Pulmonary/Chest: Effort normal and breath sounds normal. No respiratory distress. She has no wheezes. She has no rhonchi. She has no rales. She exhibits no tenderness and no crepitus.    She indicates she is having pain in her whole right chest with very minimal tenderness to palpation of her chest wall.  Abdominal: Soft. Normal appearance and bowel sounds are normal. She exhibits no distension. There is no tenderness. There is no rebound and no guarding.  Musculoskeletal: Normal range of motion. She exhibits no edema or tenderness.  Moves all extremities well.   Neurological: She is alert and oriented to person, place, and time. She has normal strength. No cranial nerve deficit.  Skin: Skin is warm, dry and intact. No rash noted. No erythema. No pallor.  Psychiatric: Her speech is normal and behavior is normal. Her mood appears not anxious.  Very flat affect  Nursing note and vitals reviewed.   ED Course  Procedures (including critical care time)  Medications  0.9 %  sodium chloride infusion (1,000 mLs Intravenous New Bag/Given 09/03/15 0628)  ketorolac (TORADOL) 30 MG/ML injection 30 mg (30 mg Intravenous Given 09/03/15 0632)  ondansetron (ZOFRAN) injection 4 mg (4 mg Intravenous Given 09/03/15 0630)  cyclobenzaprine (FLEXERIL) tablet 10 mg (10 mg Oral Given 09/03/15 0981)  dexamethasone (DECADRON) injection 10 mg (10 mg Intravenous Given 09/03/15 0713)  iopamidol (ISOVUE-370) 76 % injection 100 mL (100 mLs Intravenous Contrast Given 09/03/15 0735)    Patient was given IV Toradol and IV Zofran for her nausea and pain. We discussed repeating her lab work specifically her d-dimer to see if it is elevated compared to last time.  Recheck at 7 AM patient  states her pain is improving however she still has some discomfort. IV Decadron was ordered. We discussed her laboratory results. Patient is very concerned and wants to make sure she does not have a blood clot. CT angiogram the chest was ordered.  07:50 AM pt left at change of shift with Dr Bebe Shaggy to get results of her CTA.   Labs Review  Results for orders placed or performed during the hospital encounter of 09/03/15  Basic metabolic panel  Result Value Ref Range   Sodium 136 135 - 145 mmol/L   Potassium 4.1 3.5 - 5.1 mmol/L   Chloride 104 101 - 111 mmol/L   CO2 26 22 - 32 mmol/L   Glucose, Bld 135 (H) 65 - 99 mg/dL   BUN 12 6 - 20 mg/dL   Creatinine,  Ser 0.77 0.44 - 1.00 mg/dL   Calcium 9.3 8.9 - 96.0 mg/dL   GFR calc non Af Amer >60 >60 mL/min   GFR calc Af Amer >60 >60 mL/min   Anion gap 6 5 - 15  CBC with Differential  Result Value Ref Range   WBC 8.4 4.0 - 10.5 K/uL   RBC 3.87 3.87 - 5.11 MIL/uL   Hemoglobin 11.5 (L) 12.0 - 15.0 g/dL   HCT 45.4 (L) 09.8 - 11.9 %   MCV 91.2 78.0 - 100.0 fL   MCH 29.7 26.0 - 34.0 pg   MCHC 32.6 30.0 - 36.0 g/dL   RDW 14.7 82.9 - 56.2 %   Platelets 250 150 - 400 K/uL   Neutrophils Relative % 82 %   Neutro Abs 6.9 1.7 - 7.7 K/uL   Lymphocytes Relative 11 %   Lymphs Abs 0.9 0.7 - 4.0 K/uL   Monocytes Relative 6 %   Monocytes Absolute 0.5 0.1 - 1.0 K/uL   Eosinophils Relative 1 %   Eosinophils Absolute 0.1 0.0 - 0.7 K/uL   Basophils Relative 0 %   Basophils Absolute 0.0 0.0 - 0.1 K/uL  Troponin I  Result Value Ref Range   Troponin I <0.03 <0.031 ng/mL  D-dimer, quantitative  Result Value Ref Range   D-Dimer, Quant 2.79 (H) 0.00 - 0.50 ug/mL-FEU   Laboratory interpretation all normal except Persistently elevated d-dimer, mild anemia   Results for orders placed or performed during the hospital encounter of 08/19/15  CBC with Differential  Result Value Ref Range   WBC 8.0 4.0 - 10.5 K/uL   RBC 3.93 3.87 - 5.11 MIL/uL    Hemoglobin 11.9 (L) 12.0 - 15.0 g/dL   HCT 13.0 86.5 - 78.4 %   MCV 91.6 78.0 - 100.0 fL   MCH 30.3 26.0 - 34.0 pg   MCHC 33.1 30.0 - 36.0 g/dL   RDW 69.6 29.5 - 28.4 %   Platelets 371 150 - 400 K/uL   Neutrophils Relative % 77 %   Neutro Abs 6.2 1.7 - 7.7 K/uL   Lymphocytes Relative 17 %   Lymphs Abs 1.3 0.7 - 4.0 K/uL   Monocytes Relative 5 %   Monocytes Absolute 0.4 0.1 - 1.0 K/uL   Eosinophils Relative 1 %   Eosinophils Absolute 0.1 0.0 - 0.7 K/uL   Basophils Relative 0 %   Basophils Absolute 0.0 0.0 - 0.1 K/uL  Basic metabolic panel  Result Value Ref Range   Sodium 138 135 - 145 mmol/L   Potassium 3.7 3.5 - 5.1 mmol/L   Chloride 104 101 - 111 mmol/L   CO2 26 22 - 32 mmol/L   Glucose, Bld 96 65 - 99 mg/dL   BUN 12 6 - 20 mg/dL   Creatinine, Ser 1.32 0.44 - 1.00 mg/dL   Calcium 9.1 8.9 - 44.0 mg/dL   GFR calc non Af Amer >60 >60 mL/min   GFR calc Af Amer >60 >60 mL/min   Anion gap 8 5 - 15  D-dimer, quantitative (not at [facility])  Result Value Ref Range   D-Dimer, Quant 2.92 (H) 0.00 - 0.50 ug/mL-FEU    Imaging Review CTA pending    Dg Chest 2 View  08/19/2015  CLINICAL DATA:  Acute onset of right-sided chest pain. Initial encounter.  IMPRESSION: Mild peribronchial thickening noted.  Lungs otherwise clear. Electronically Signed   By: Roanna Raider M.D.   On: 08/19/2015 23:12   Ct Angio Chest Pe W/cm &/  or Wo Cm  08/20/2015  CLINICAL DATA:  Acute onset of right-sided chest and abdominal pain. Initial encounter. Marland Kitchen. IMPRESSION: 1. No evidence of pulmonary embolus. 2. Minimal bibasilar atelectasis noted.  Lungs otherwise clear. Electronically Signed   By: Roanna RaiderJeffery  Chang M.D.   On: 08/20/2015 01:31   I have personally reviewed and evaluated these images and lab results as part of my medical decision-making.   EKG Interpretation   Date/Time:  Saturday September 03 2015 06:26:16 EDT Ventricular Rate:  101 PR Interval:    QRS Duration: 88 QT Interval:  332 QTC Calculation:  431 R Axis:   35 Text Interpretation:  Sinus tachycardia Possible Left atrial enlargement  No significant change since last tracing 19 Aug 2015 Confirmed by Davanee Klinkner   MD-I, Elfego Giammarino ([id number]) on 09/03/2015 7:03:25 AM      MDM   Final diagnoses:  Right-sided chest pain  Pleuritic chest pain    disposition pending per Dr Bebe ShaggyWickline  Devoria AlbeIva Corbyn Wildey, MD, Concha PyoFACEP     Lyndie Vanderloop, MD 09/03/15 [phone]

## 2015-09-03 NOTE — ED Notes (Signed)
Pt ambulated in hallway, around nurses station. HR 117   02 100%

## 2015-09-03 NOTE — ED Notes (Signed)
MD at bedside. 

## 2015-09-04 ENCOUNTER — Encounter (HOSPITAL_COMMUNITY): Payer: Self-pay | Admitting: Internal Medicine

## 2015-09-04 ENCOUNTER — Encounter (HOSPITAL_COMMUNITY): Payer: Self-pay | Admitting: *Deleted

## 2015-09-04 ENCOUNTER — Emergency Department (HOSPITAL_COMMUNITY)
Admission: EM | Admit: 2015-09-04 | Discharge: 2015-09-05 | Disposition: A | Discharge status: Home / Self Care | Payer: 59 | Attending: Emergency Medicine | Admitting: Emergency Medicine

## 2015-09-04 DIAGNOSIS — Z7984 Long term (current) use of oral hypoglycemic drugs: Secondary | ICD-10-CM | POA: Insufficient documentation

## 2015-09-04 DIAGNOSIS — R001 Bradycardia, unspecified: Secondary | ICD-10-CM | POA: Diagnosis not present

## 2015-09-04 DIAGNOSIS — F329 Major depressive disorder, single episode, unspecified: Secondary | ICD-10-CM | POA: Insufficient documentation

## 2015-09-04 DIAGNOSIS — Z87891 Personal history of nicotine dependence: Secondary | ICD-10-CM | POA: Insufficient documentation

## 2015-09-04 DIAGNOSIS — Z79899 Other long term (current) drug therapy: Secondary | ICD-10-CM | POA: Insufficient documentation

## 2015-09-04 DIAGNOSIS — O24419 Gestational diabetes mellitus in pregnancy, unspecified control: Secondary | ICD-10-CM

## 2015-09-04 DIAGNOSIS — N939 Abnormal uterine and vaginal bleeding, unspecified: Secondary | ICD-10-CM | POA: Diagnosis not present

## 2015-09-04 DIAGNOSIS — R079 Chest pain, unspecified: Secondary | ICD-10-CM

## 2015-09-04 DIAGNOSIS — I2699 Other pulmonary embolism without acute cor pulmonale: Secondary | ICD-10-CM

## 2015-09-04 DIAGNOSIS — R110 Nausea: Secondary | ICD-10-CM | POA: Insufficient documentation

## 2015-09-04 LAB — BASIC METABOLIC PANEL
ANION GAP: 4 — AB (ref 5–15)
BUN: 14 mg/dL (ref 6–20)
CALCIUM: 8.6 mg/dL — AB (ref 8.9–10.3)
CO2: 24 mmol/L (ref 22–32)
CREATININE: 0.58 mg/dL (ref 0.44–1.00)
Chloride: 110 mmol/L (ref 101–111)
Glucose, Bld: 103 mg/dL — ABNORMAL HIGH (ref 65–99)
Potassium: 3.7 mmol/L (ref 3.5–5.1)
SODIUM: 138 mmol/L (ref 135–145)

## 2015-09-04 LAB — COMPREHENSIVE METABOLIC PANEL
ALBUMIN: 3 g/dL — AB (ref 3.5–5.0)
ALT: 41 U/L (ref 14–54)
ANION GAP: 3 — AB (ref 5–15)
AST: 20 U/L (ref 15–41)
Alkaline Phosphatase: 107 U/L (ref 38–126)
BUN: 9 mg/dL (ref 6–20)
CALCIUM: 8.8 mg/dL — AB (ref 8.9–10.3)
CO2: 26 mmol/L (ref 22–32)
CREATININE: 0.63 mg/dL (ref 0.44–1.00)
Chloride: 110 mmol/L (ref 101–111)
GFR calc non Af Amer: >60 mL/min (ref >60)
GLUCOSE: 159 mg/dL — AB (ref 65–99)
POTASSIUM: 4.5 mmol/L (ref 3.5–5.1)
SODIUM: 139 mmol/L (ref 135–145)
TOTAL PROTEIN: 7 g/dL (ref 6.5–8.1)
Total Bilirubin: 0.4 mg/dL (ref 0.3–1.2)

## 2015-09-04 LAB — CBC
HCT: 33.2 % — ABNORMAL LOW (ref 36.0–46.0)
HCT: 32.6 % — ABNORMAL LOW (ref 36.0–46.0)
HEMOGLOBIN: 10.5 g/dL — AB (ref 12.0–15.0)
Hemoglobin: 10.6 g/dL — ABNORMAL LOW (ref 12.0–15.0)
MCH: 29.9 pg (ref 26.0–34.0)
MCH: 30 pg (ref 26.0–34.0)
MCHC: 31.9 g/dL (ref 30.0–36.0)
MCHC: 32.2 g/dL (ref 30.0–36.0)
MCV: 93.8 fL (ref 78.0–100.0)
MCV: 93.1 fL (ref 78.0–100.0)
PLATELETS: 272 10*3/uL (ref 150–400)
PLATELETS: 250 10*3/uL (ref 150–400)
RBC: 3.54 MIL/uL — ABNORMAL LOW (ref 3.87–5.11)
RBC: 3.5 MIL/uL — AB (ref 3.87–5.11)
RDW: 13.3 % (ref 11.5–15.5)
RDW: 13.5 % (ref 11.5–15.5)
WBC: 11.8 10*3/uL — ABNORMAL HIGH (ref 4.0–10.5)
WBC: 10.1 10*3/uL (ref 4.0–10.5)

## 2015-09-04 MED ORDER — APIXABAN 5 MG PO TABS: 5.0000 mg | ORAL_TABLET | Freq: Two times a day (BID) | ORAL | Status: DC

## 2015-09-04 MED ORDER — GLIMEPIRIDE 1 MG PO TABS: ORAL_TABLET | ORAL | Status: DC

## 2015-09-04 MED ORDER — APIXABAN 5 MG PO TABS
10.0000 mg | ORAL_TABLET | Freq: Two times a day (BID) | ORAL | Status: DC
  Administered 2015-09-04: 10 mg via ORAL
  Filled 2015-09-04 (×2): qty 2

## 2015-09-04 MED ORDER — APIXABAN 5 MG PO TABS: ORAL_TABLET | ORAL | Status: DC

## 2015-09-04 NOTE — Progress Notes (Signed)
IV discontinued. Telemetry d/c'd. Pt. Discharged home with instructions.

## 2015-09-04 NOTE — ED Notes (Signed)
Patient's pad does not have any blood on it at this time.

## 2015-09-04 NOTE — ED Notes (Addendum)
Pt states that she was discharged from hospital for PE's today, started having vaginal bleeding after she got home, at first pt had noticed the bleeding on her underwear while in [employer], placed a maxi pad in place but has not rechecked to see if she how much bleeding she is having, had a c-section 4 weeks ago,

## 2015-09-04 NOTE — Progress Notes (Signed)
CM spoke with patient regarding Eliquis.  Pharmacy provided a 30 day trial offer card for medication.  [facility].

## 2015-09-04 NOTE — ED Provider Notes (Signed)
CSN: [id number]     Arrival date & time 09/04/15  1911 History  By signing my name below, I, Brianna Russell, attest that this documentation has been prepared under the direction and in the presence of Vanetta MuldersScott Ericia Moxley, MD.  Electronically Signed: Gillis EndsJasmyn B. Lyn HollingsheadAlexander, ED Scribe. 09/04/2015. 9:41 PM.    Chief Complaint  Patient presents with  . Vaginal Bleeding   Patient is a 25 y.o. female presenting with vaginal bleeding. The history is provided by the patient. No language interpreter was used.  Vaginal Bleeding Severity:  Moderate Onset quality:  Sudden Duration:  4 hours Timing:  Intermittent Possible pregnancy: no   Associated symptoms: nausea   Associated symptoms: no abdominal pain, no back pain, no dysuria and no fever    HPI Comments: Brianna HampshireSarah D Russell is a 25 y.o. female who presents to the Emergency Department complaining of sudden onset, intermittent, vaginal bleeding that began about 4 hours PTA. Pt states [facility] on 09/04/15 after being diagnosed with a Chest PE. She notes that she was at the grocery store when she went to the bathroom and noticed blood on her underwear. However, while in [employer], there is no blood on her sanitary napkin at this time. Pt states she recently gave birth on 08/05/15 via C-section. She notes her OB-GYN physician is [facility]retchen [facility] at [facility]. There are no modifying factors. She is currently taking Lovenox after PE diagnosis. Denies any abdominal pain, nausea, or vomiting.  Past Medical History  Diagnosis Date  . Hx of varicella   . Anxiety   . Depression   . Pleurisy   . Gestational diabetes   . Bilateral pulmonary embolism (HCC) 09/03/2015   Past Surgical History  Procedure Laterality Date  . Cholecystectomy    . Cesarean section    . Cesarean section N/A 08/05/2015    Procedure: REPEAT CESAREAN SECTION;  Surgeon: Richarda Overlieichard Holland, MD;  [facility];  Service: Obstetrics;  Laterality:  N/A;  Kennith Centerracey T to RNFA - confirmed edc 08/23/15     Family History  Problem Relation Age of Onset  . Diabetes Father   . Stroke Father   . Hypertension Father   . Heart disease Father   . Cancer Paternal Aunt   . Cancer Paternal Grandmother   . Cancer Paternal Grandfather    Social History  Substance Use Topics  . Smoking status: Former Smoker    Types: Cigarettes    Quit date: 08/05/2007  . Smokeless tobacco: None  . Alcohol Use: No   OB History    Gravida Para Term Preterm AB TAB SAB Ectopic Multiple Living   2 2 1       0 2     Review of Systems  Constitutional: Negative for fever and diaphoresis.  HENT: Negative for rhinorrhea and sore throat.   Eyes: Negative for visual disturbance.  Respiratory: Positive for shortness of breath. Negative for cough.   Cardiovascular: Positive for chest pain. Negative for leg swelling.  Gastrointestinal: Positive for nausea. Negative for vomiting, abdominal pain and diarrhea.  Genitourinary: Positive for vaginal bleeding. Negative for dysuria.  Musculoskeletal: Negative for back pain.  Skin: Negative for rash.  Neurological: Negative for headaches.  Hematological: Bruises/bleeds easily.  Psychiatric/Behavioral: Negative for confusion.    Allergies  Ibuprofen  Home Medications   Prior to Admission medications   Medication Sig Start Date End Date Taking? Authorizing Provider  acetaminophen (TYLENOL) 500 MG tablet Take 500 mg  by mouth every 6 (six) hours as needed for mild pain or moderate pain.   Yes Historical Provider, MD  apixaban (ELIQUIS) 5 MG TABS tablet Take 2 tablets 2 times daily for 7 days and then 1 tablet 2 times daily thereafter. Patient taking differently: Take 10 mg by mouth 2 (two) times daily. Take 2 tablets 2 times daily for 7 days and then 1 tablet 2 times daily thereafter. 09/04/15  Yes Elliot Cousin, MD  glimepiride (AMARYL) 1 MG tablet Take 1 tablet daily as needed for blood sugar greater than 150. 09/04/15   Yes Elliot Cousin, MD   BP 107/74 mmHg  Pulse 57  Temp(Src) 98.5 F (36.9 C) (Oral)  Resp 16  Ht  (1.626 m)  Wt 112.492 kg  BMI 42.55 kg/m2  SpO2 98%  LMP 11/16/2014 Physical Exam  Constitutional: She is oriented to person, place, and time. She appears well-developed and well-nourished. No distress.  HENT:  Head: Normocephalic and atraumatic.  Mouth/Throat: Oropharynx is clear and moist.  Eyes: Conjunctivae and EOM are normal. Pupils are equal, round, and reactive to light. No scleral icterus.  Cardiovascular: Normal rate, regular rhythm and normal heart sounds.   No murmur heard. Capillary refill to both big toes is 1 second.   Pulmonary/Chest: Effort normal and breath sounds normal. No respiratory distress. She has no wheezes.  Abdominal: Bowel sounds are normal. She exhibits no distension. There is no tenderness.  Musculoskeletal: She exhibits no edema.  No swelling of the ankles.   Neurological: She is alert and oriented to person, place, and time. No cranial nerve deficit. She exhibits normal muscle tone. Coordination normal.  Skin: Skin is warm and dry.  C-section incision healing well.  Psychiatric: She has a normal mood and affect.  Nursing note and vitals reviewed.   ED Course  Procedures (including critical care time) DIAGNOSTIC STUDIES: Oxygen Saturation is 100% on RA, normal by my interpretation.    COORDINATION OF CARE: 9:25 PM-Discussed treatment plan which includes BMP and CBC with pt at bedside and pt agreed to plan.   Labs Review Labs Reviewed  BASIC METABOLIC PANEL - Abnormal; Notable for the following:    Glucose, Bld 103 (*)    Calcium 8.6 (*)    Anion gap 4 (*)    All other components within normal limits  CBC - Abnormal; Notable for the following:    RBC 3.50 (*)    Hemoglobin 10.5 (*)    HCT 32.6 (*)    All other components within normal limits    Imaging Review Ct Angio Chest Pe W/cm &/or Wo Cm  09/03/2015  CLINICAL DATA:  Right  pleuritic chest pain and shortness of breath. One month postpartum. EXAM: CT ANGIOGRAPHY CHEST WITH CONTRAST TECHNIQUE: Multidetector CT imaging of the chest was performed using the standard protocol during bolus administration of intravenous contrast. Multiplanar CT image reconstructions and MIPs were obtained to evaluate the vascular anatomy. CONTRAST:  100 cc Isovue 370 IV. COMPARISON:  08/20/2015 chest CT angiogram. FINDINGS: Mediastinum/Nodes: The study is moderate quality for the evaluation of pulmonary embolism, with partial limitation by motion and limited bolus opacification of the pulmonary arteries. There are acute subsegmental pulmonary emboli in the bilateral lower lobes. No central, lobar or segmental pulmonary emboli. Great vessels are normal in course and caliber. Normal heart size. RV/LV ratio 0.81. No significant pericardial fluid/thickening. No discrete thyroid nodules. Unremarkable esophagus. No pathologically enlarged axillary, mediastinal or hilar lymph nodes. Lungs/Pleura: No pneumothorax. Trace layering  right pleural effusion. No left pleural effusion. Subsegmental atelectasis at the right lung base. Mosaic attenuation in the lower lungs. Otherwise no acute consolidative airspace disease, significant pulmonary nodules or lung masses. Upper abdomen: Unremarkable. Musculoskeletal: No aggressive appearing focal osseous lesions. Mild thoracic spondylosis. Review of the MIP images confirms the above findings. IMPRESSION: 1. Acute subsegmental pulmonary emboli in the bilateral lower lobes. 2. No CT evidence of pulmonary hypertension or right heart strain. 3. Trace right pleural effusion. 4. Mosaic attenuation in the lower lungs, a nonspecific finding that could indicate mosaic perfusion due to the pulmonary embolism and/or air trapping due to small airways disease. 5. Subsegmental right lung base atelectasis. Critical Value/emergent results were called by telephone at the time of interpretation on  09/03/2015 at 8:03 am to Dr. Bebe ShaggyWICKLINE, who verbally acknowledged these results. Electronically Signed   By: Delbert PhenixJason A Poff M.D.   On: 09/03/2015 08:04   I have personally reviewed and evaluated these images and lab results as part of my medical decision-making.   EKG Interpretation None      MDM   Final diagnoses:  Other acute pulmonary embolism without acute cor pulmonale (HCC)  Vaginal bleeding    Patient admitted yesterday discharged home today for bilateral subsegmental PEs. Patient treated initially in the hospital and Lovenox and switched over to Eliquis.  Patient status post C-section delivery May 26. Today at around 5 PM patient had vaginal bleeding which was fairly heavy but has since stopped. No further bleeding. No abdominal pain. Patient does not feel lightheaded or feel like she'll pass out. Patient still with some right-sided anterior chest pain and pain to the back. This been consistent with the reported pain she's had from the pulmonary embolus. No new pain no worsening shortness of breath. No hypoxia.  Patient can continue her blood thinners follow-up with her primary care doctor recommend that she contact her OB/GYN make them aware of the vaginal bleeding. Patient will return for any new or worse symptoms.  Patient's hemoglobin and hematocrit without any significant change here today.  I personally performed the services described in this documentation, which was scribed in my presence. The recorded information has been reviewed and is accurate.      Vanetta MuldersScott Christen Wardrop, MD 09/05/15 [phone]

## 2015-09-04 NOTE — Discharge Instructions (Signed)
Information on my medicine - ELIQUIS (apixaban)  This medication education was reviewed with me or my healthcare representative as part of my discharge preparation.  The pharmacist that spoke with me during my hospital stay was:  Woodfin GanjaSeay, Estes Lehner Poteet, [facility]  Why was Eliquis prescribed for you? Eliquis was prescribed to treat blood clots that may have been found in the veins of your legs (deep vein thrombosis) or in your lungs (pulmonary embolism) and to reduce the risk of them occurring again.  What do You need to know about Eliquis ? The starting dose is 10 mg (two 5 mg tablets) taken TWICE daily for the FIRST SEVEN (7) DAYS, then on 09/11/14,   the dose is reduced to ONE 5 mg tablet taken TWICE daily.  Eliquis may be taken with or without food.   Try to take the dose about the same time in the morning and in the evening. If you have difficulty swallowing the tablet whole please discuss with your pharmacist how to take the medication safely.  Take Eliquis exactly as prescribed and DO NOT stop taking Eliquis without talking to the doctor who prescribed the medication.  Stopping may increase your risk of developing a new blood clot.  Refill your prescription before you run out.  After discharge, you should have regular check-up appointments with your healthcare provider that is prescribing your Eliquis.    What do you do if you miss a dose? If a dose of ELIQUIS is not taken at the scheduled time, take it as soon as possible on the same day and twice-daily administration should be resumed. The dose should not be doubled to make up for a missed dose.  Important Safety Information A possible side effect of Eliquis is bleeding. You should call your healthcare provider right away if you experience any of the following: ? Bleeding from an injury or your nose that does not stop. ? Unusual colored urine (red or dark brown) or unusual colored stools (red or black). Unusual bruising for unknown  reasons.Information on my medicine - ELIQUIS (apixaban)  This medication education was reviewed with me or my healthcare representative as part of my discharge preparation.  The pharmacist that spoke with me during my hospital stay was:  Woodfin GanjaSeay, Nekhi Liwanag Poteet, [facility]  Why was Eliquis prescribed for you? Eliquis was prescribed to treat blood clots that may have been found in the veins of your legs (deep vein thrombosis) or in your lungs (pulmonary embolism) and to reduce the risk of them occurring again.  What do You need to know about Eliquis ? The starting dose is 10 mg (two 5 mg tablets) taken TWICE daily for the FIRST SEVEN (7) DAYS, then on 09/11/15  the dose is reduced to ONE 5 mg tablet taken TWICE daily.  Eliquis may be taken with or without food.   Try to take the dose about the same time in the morning and in the evening. If you have difficulty swallowing the tablet whole please discuss with your pharmacist how to take the medication safely.  Take Eliquis exactly as prescribed and DO NOT stop taking Eliquis without talking to the doctor who prescribed the medication.  Stopping may increase your risk of developing a new blood clot.  Refill your prescription before you run out.  After discharge, you should have regular check-up appointments with your healthcare provider that is prescribing your Eliquis.    What do you do if you miss a dose? If a dose of ELIQUIS  is not taken at the scheduled time, take it as soon as possible on the same day and twice-daily administration should be resumed. The dose should not be doubled to make up for a missed dose.  Important Safety Information A possible side effect of Eliquis is bleeding. You should call your healthcare provider right away if you experience any of the following: Bleeding from an injury or your nose that does not stop. Unusual colored urine (red or dark brown) or unusual colored stools (red or black). Unusual bruising for unknown  reasons. A serious fall or if you hit your head (even if there is no bleeding).  Some medicines may interact with Eliquis and might increase your risk of bleeding or clotting while on Eliquis. To help avoid this, consult your healthcare provider or pharmacist prior to using any new prescription or non-prescription medications, including herbals, vitamins, non-steroidal anti-inflammatory drugs (NSAIDs) and supplements.  This website has more information on Eliquis (apixaban): http://www.eliquis.com/eliquis/home ?  ? A serious fall or if you hit your head (even if there is no bleeding).  Some medicines may interact with Eliquis and might increase your risk of bleeding or clotting while on Eliquis. To help avoid this, consult your healthcare provider or pharmacist prior to using any new prescription or non-prescription medications, including herbals, vitamins, non-steroidal anti-inflammatory drugs (NSAIDs) and supplements.  This website has more information on Eliquis (apixaban): http://www.eliquis.com/eliquis/home

## 2015-09-04 NOTE — Discharge Summary (Signed)
Physician Discharge Summary  Brianna Russell AOZ:[id number] DOB: 1990/09/23 DOA: 09/03/2015  PCP: Louie Boston, MD  Admit date: 09/03/2015 Discharge date: 09/04/2015  Time spent: Greater than 30 minutes  Recommendations for Outpatient Follow-up:  1. Hypercoagulable panel pending. Recommend follow-up of the results.  2. Recommend follow-up of the patient's blood glucose; history of gestational diabetes and elevated blood glucose during the hospitalization.    Discharge Diagnoses:  1. Bilateral pulmonary emboli 2. Pleuritic chest pain secondary to #1. Resolved. 3. Recent gestational diabetes with mild hyperglycemia during the hospitalization. 4. Morbid obesity. 5. Normocytic anemia. 6. Transient bradycardia while asleep.  Discharge Condition: Improved  Diet recommendation: Heart healthy/carbohydrate modified  Filed Weights   09/03/15 0423  Weight: 112.492 kg (248 lb)    History of present illness:  Patient is a 25 y.o. female with medical history significant for a recent pregnancy/delivery with C-section on 08/05/2015, obesity, and depression, who presents to the ED with a chief complaint of shortness of breath and right-sided chest pain.  In the ED, she was afebrile and hemodynamically stable. She was oxygenating in the upper 90s on room air. Her lab data were significant for an elevated d-dimer of 2.79, normal troponin I, and hemoglobin of 11.5. CT angiogram of the chest revealed acute subsegmental pulmonary emboli in the bilateral lower lobes; no CT evidence of pulmonary hypertension or right heart strain; trace right pleural effusion; mosaic attenuation in the lower lobes, a nonspecific finding. She was admitted for evaluation and management of PE.  Hospital Course:  Patient reported no known family history of blood clots. She had not been taking OCA's. She denied any recent travel. She was not a smoker. Patient was started on IV Lovenox. Supportive treatment was given with as  needed analgesics for pain. Gentle IV fluids were given. Oxygen was applied as needed for comfort. She was not hypoxic. Follow-up blood work revealed a mild leukocytosis, but with no fever and no evidence of an active infection. Hemoglobin fell slightly from 11.5-10.6 which was likely dilutional in the setting of recent blood loss from C-section delivery. Her venous glucose ranged from 135-159. She reported a history of gestational diabetes which was treated with an oral agent during her pregnancy. Apparently, she had stopped taking it. Insulin was not given during this hospitalization, but she was discharged with a prescription for Amaryl 1 mg when necessary for CBGs greater than 150. She was instructed to check her blood sugars if not daily, several days per week.  Patient's pleuritic chest pain resolved. Eliquis was started for outpatient anticoagulation management when Lovenox was discontinued. She was discharged with instructions for dosing. She voiced understanding. The etiology of her pulmonary emboli was unknown. Her risk factors included obesity and C-section delivery 4 weeks ago.Hypercoagulable panel was ordered for further evaluation. The results were pending at the time of discharge. The dictating physician will review the results when available and inform the patient accordingly.  Procedures:  None  Consultations:  None  Discharge Exam: Filed Vitals:   09/04/15 0654 09/04/15 1312  BP: 133/75 105/68  Pulse: 49 64  Temp: 97.8 F (36.6 C) 97.9 F (36.6 C)  Resp: 16 20  Oxygen saturation 99% on room air.  General: Pleasant obese 25 year old woman in no acute distress. Cardiovascular: S1, S2, no murmurs rubs or gallops. Respiratory: Clear to auscultation bilaterally without splinting. Extremities: No pedal edema or calf pain.  Discharge Instructions   Discharge Instructions    Call MD for:  difficulty breathing, headache or  visual disturbances    Complete by:  As directed       Call MD for:  temperature >100.4    Complete by:  As directed      Diet - low sodium heart healthy    Complete by:  As directed      Increase activity slowly    Complete by:  As directed           Current Discharge Medication List    START taking these medications   Details  apixaban (ELIQUIS) 5 MG TABS tablet Take 2 tablets 2 times daily for 7 days and then 1 tablet 2 times daily thereafter. Qty: 60 tablet, Refills: 3    glimepiride (AMARYL) 1 MG tablet Take 1 tablet daily as needed for blood sugar greater than 150. Qty: 30 tablet, Refills: 1      CONTINUE these medications which have NOT CHANGED   Details  acetaminophen (TYLENOL) 500 MG tablet Take 500 mg by mouth every 6 (six) hours as needed for mild pain or moderate pain.      STOP taking these medications     ibuprofen (ADVIL,MOTRIN) 100 MG/5ML suspension      oxyCODONE (ROXICODONE) 5 MG/5ML solution        Allergies  Allergen Reactions  . Ibuprofen Nausea Only    Headache    Follow-up Information    Follow up with TAPPER,DAVID B, MD. Schedule an appointment as soon as possible for a visit in 1 week.   Specialty:  Family Medicine   Contact information:   [address] [place] [id number] [phone]        The results of significant diagnostics from this hospitalization (including imaging, microbiology, ancillary and laboratory) are listed below for reference.    Significant Diagnostic Studies: Dg Chest 2 View  08/19/2015  CLINICAL DATA:  Acute onset of right-sided chest pain. Initial encounter. EXAM: CHEST  2 VIEW COMPARISON:  None. FINDINGS: The lungs are well-aerated. Mild peribronchial thickening is noted. There is no evidence of focal opacification, pleural effusion or pneumothorax. The heart is normal in size; the mediastinal contour is within normal limits. No acute osseous abnormalities are seen. IMPRESSION: Mild peribronchial thickening noted.  Lungs otherwise clear. Electronically Signed    By: Roanna RaiderJeffery  Chang M.D.   On: 08/19/2015 23:12   Ct Angio Chest Pe W/cm &/or Wo Cm  09/03/2015  CLINICAL DATA:  Right pleuritic chest pain and shortness of breath. One month postpartum. EXAM: CT ANGIOGRAPHY CHEST WITH CONTRAST TECHNIQUE: Multidetector CT imaging of the chest was performed using the standard protocol during bolus administration of intravenous contrast. Multiplanar CT image reconstructions and MIPs were obtained to evaluate the vascular anatomy. CONTRAST:  100 cc Isovue 370 IV. COMPARISON:  08/20/2015 chest CT angiogram. FINDINGS: Mediastinum/Nodes: The study is moderate quality for the evaluation of pulmonary embolism, with partial limitation by motion and limited bolus opacification of the pulmonary arteries. There are acute subsegmental pulmonary emboli in the bilateral lower lobes. No central, lobar or segmental pulmonary emboli. Great vessels are normal in course and caliber. Normal heart size. RV/LV ratio 0.81. No significant pericardial fluid/thickening. No discrete thyroid nodules. Unremarkable esophagus. No pathologically enlarged axillary, mediastinal or hilar lymph nodes. Lungs/Pleura: No pneumothorax. Trace layering right pleural effusion. No left pleural effusion. Subsegmental atelectasis at the right lung base. Mosaic attenuation in the lower lungs. Otherwise no acute consolidative airspace disease, significant pulmonary nodules or lung masses. Upper abdomen: Unremarkable. Musculoskeletal: No aggressive appearing focal osseous  lesions. Mild thoracic spondylosis. Review of the MIP images confirms the above findings. IMPRESSION: 1. Acute subsegmental pulmonary emboli in the bilateral lower lobes. 2. No CT evidence of pulmonary hypertension or right heart strain. 3. Trace right pleural effusion. 4. Mosaic attenuation in the lower lungs, a nonspecific finding that could indicate mosaic perfusion due to the pulmonary embolism and/or air trapping due to small airways disease. 5.  Subsegmental right lung base atelectasis. Critical Value/emergent results were called by telephone at the time of interpretation on 09/03/2015 at 8:03 am to Dr. Bebe ShaggyWICKLINE, who verbally acknowledged these results. Electronically Signed   By: Delbert PhenixJason A Poff M.D.   On: 09/03/2015 08:04   Ct Angio Chest Pe W/cm &/or Wo Cm  08/20/2015  CLINICAL DATA:  Acute onset of right-sided chest and abdominal pain. Initial encounter. EXAM: CT ANGIOGRAPHY CHEST WITH CONTRAST TECHNIQUE: Multidetector CT imaging of the chest was performed using the standard protocol during bolus administration of intravenous contrast. Multiplanar CT image reconstructions and MIPs were obtained to evaluate the vascular anatomy. CONTRAST:  100 mL of Isovue 370 IV contrast COMPARISON:  Chest radiograph performed 08/19/2015 FINDINGS: There is no evidence of pulmonary embolus. Minimal bibasilar atelectasis is noted. The lungs are otherwise clear. There is no evidence of significant focal consolidation, pleural effusion or pneumothorax. No masses are identified; no abnormal focal contrast enhancement is seen. The mediastinum is unremarkable in appearance. No mediastinal lymphadenopathy is seen. No pericardial effusion is identified. The great vessels are grossly unremarkable in appearance. No axillary lymphadenopathy is seen. The visualized portions of the thyroid gland are unremarkable in appearance. The visualized portions of the liver and spleen are unremarkable. The visualized portions of the pancreas, adrenal glands and left kidney are grossly unremarkable. No acute osseous abnormalities are seen. Review of the MIP images confirms the above findings. IMPRESSION: 1. No evidence of pulmonary embolus. 2. Minimal bibasilar atelectasis noted.  Lungs otherwise clear. Electronically Signed   By: Roanna RaiderJeffery  Chang M.D.   On: 08/20/2015 01:31    Microbiology: No results found for this or any previous visit (from the past 240 hour(s)).   Labs: Basic Metabolic  Panel:  Recent Labs Lab 09/03/15 0620 09/04/15 0522  NA 136 139  K 4.1 4.5  CL 104 110  CO2 26 26  GLUCOSE 135* 159*  BUN 12 9  CREATININE 0.77 0.63  CALCIUM 9.3 8.8*   Liver Function Tests:  Recent Labs Lab 09/04/15 0522  AST 20  ALT 41  ALKPHOS 107  BILITOT 0.4  PROT 7.0  ALBUMIN 3.0*   No results for input(s): LIPASE, AMYLASE in the last 168 hours. No results for input(s): AMMONIA in the last 168 hours. CBC:  Recent Labs Lab 09/03/15 0620 09/04/15 0522  WBC 8.4 11.8*  NEUTROABS 6.9  --   HGB 11.5* 10.6*  HCT 35.3* 33.2*  MCV 91.2 93.8  PLT 250 272   Cardiac Enzymes:  Recent Labs Lab 09/03/15 0620  TROPONINI <0.03   BNP: BNP (last 3 results) No results for input(s): BNP in the last 8760 hours.  ProBNP (last 3 results) No results for input(s): PROBNP in the last 8760 hours.  CBG: No results for input(s): GLUCAP in the last 168 hours.     Signed:  Skyah Hannon MD.  [facility] 09/04/2015, 1:22 PM

## 2015-09-05 LAB — PROTEIN S ACTIVITY: PROTEIN S ACTIVITY: 76 % (ref 63–140)

## 2015-09-05 LAB — PROTEIN S, TOTAL: PROTEIN S AG TOTAL: 112 % (ref 60–150)

## 2015-09-05 LAB — BETA-2-GLYCOPROTEIN I ABS, IGG/M/A
Beta-2-Glycoprotein I IgA: <9 GPI IgA units (ref 0–25)
Beta-2-Glycoprotein I IgM: <9 GPI IgM units (ref 0–32)

## 2015-09-05 LAB — PROTEIN C ACTIVITY: PROTEIN C ACTIVITY: 131 % (ref 73–180)

## 2015-09-05 LAB — HOMOCYSTEINE: Homocysteine: 5.6 umol/L (ref 0.0–15.0)

## 2015-09-05 NOTE — Discharge Instructions (Signed)
Return for any worse vaginal bleeding. Call your OB/GYN doctor to update her about the bleeding that occurred today and stopped. Continue your blood thinner medicine. Follow-up with your regular doctor for results of the clotting panel. Return for worse shortness of breath or chest pain in a new area.

## 2015-09-06 ENCOUNTER — Encounter (HOSPITAL_COMMUNITY): Payer: Self-pay | Admitting: Adult Health

## 2015-09-06 ENCOUNTER — Emergency Department (HOSPITAL_COMMUNITY)
Admission: EM | Admit: 2015-09-06 | Discharge: 2015-09-07 | Disposition: A | Discharge status: Home / Self Care | Payer: 59 | Attending: Emergency Medicine | Admitting: Emergency Medicine

## 2015-09-06 DIAGNOSIS — R748 Abnormal levels of other serum enzymes: Secondary | ICD-10-CM | POA: Diagnosis not present

## 2015-09-06 DIAGNOSIS — J90 Pleural effusion, not elsewhere classified: Secondary | ICD-10-CM | POA: Insufficient documentation

## 2015-09-06 DIAGNOSIS — R7989 Other specified abnormal findings of blood chemistry: Secondary | ICD-10-CM

## 2015-09-06 DIAGNOSIS — I2699 Other pulmonary embolism without acute cor pulmonale: Secondary | ICD-10-CM | POA: Insufficient documentation

## 2015-09-06 DIAGNOSIS — Z7901 Long term (current) use of anticoagulants: Secondary | ICD-10-CM | POA: Insufficient documentation

## 2015-09-06 DIAGNOSIS — F329 Major depressive disorder, single episode, unspecified: Secondary | ICD-10-CM | POA: Insufficient documentation

## 2015-09-06 DIAGNOSIS — Z87891 Personal history of nicotine dependence: Secondary | ICD-10-CM | POA: Insufficient documentation

## 2015-09-06 DIAGNOSIS — R0781 Pleurodynia: Secondary | ICD-10-CM

## 2015-09-06 DIAGNOSIS — R071 Chest pain on breathing: Secondary | ICD-10-CM | POA: Diagnosis present

## 2015-09-06 LAB — PROTEIN C, TOTAL: Protein C, Total: 100 % (ref 60–150)

## 2015-09-06 LAB — LUPUS ANTICOAGULANT PANEL
DRVVT: 53.5 s — ABNORMAL HIGH (ref 0.0–47.0)
PTT LA: 55.8 s — AB (ref 0.0–43.6)

## 2015-09-06 LAB — DRVVT MIX: DRVVT MIX: 45.5 s (ref 0.0–47.0)

## 2015-09-06 LAB — HEXAGONAL PHASE PHOSPHOLIPID: Hexagonal Phase Phospholipid: 8 s (ref 0–11)

## 2015-09-06 LAB — CARDIOLIPIN ANTIBODIES, IGG, IGM, IGA: Anticardiolipin IgA: <9 APL U/mL (ref 0–11)

## 2015-09-06 LAB — PTT-LA MIX: PTT-LA Mix: 52.1 s — ABNORMAL HIGH (ref 0.0–40.6)

## 2015-09-06 MED ORDER — SODIUM CHLORIDE 0.9 % IV SOLN
1000.0000 mL | Freq: Once | INTRAVENOUS | Status: AC
  Administered 2015-09-07: 1000 mL via INTRAVENOUS

## 2015-09-06 MED ORDER — ONDANSETRON HCL 4 MG/2ML IJ SOLN
4.0000 mg | Freq: Once | INTRAMUSCULAR | Status: AC
  Administered 2015-09-07: 4 mg via INTRAVENOUS
  Filled 2015-09-06: qty 2

## 2015-09-06 MED ORDER — SODIUM CHLORIDE 0.9 % IV SOLN
1000.0000 mL | INTRAVENOUS | Status: DC
  Administered 2015-09-07: 1000 mL via INTRAVENOUS

## 2015-09-06 MED ORDER — MORPHINE SULFATE (PF) 4 MG/ML IV SOLN
4.0000 mg | Freq: Once | INTRAVENOUS | Status: AC
  Administered 2015-09-07: 4 mg via INTRAVENOUS
  Filled 2015-09-06: qty 1

## 2015-09-06 NOTE — ED Provider Notes (Signed)
CSN: [id number]     Arrival date & time 09/06/15  2319 History  By signing my name below, I, [name], attest that this documentation has been prepared under the direction and in the presence of Dione Booze, MD. Electronically Signed: [name], ED Scribe. 09/06/2015. 11:54 PM.    Chief Complaint  Patient presents with  . Chest Pain   Patient is a 25 y.o. female presenting with chest pain. The history is provided by the patient. No language interpreter was used.  Chest Pain  HPI Comments: Brianna Russell is a 25 y.o. female who presents to the Emergency Department complaining of worsening right sided chest pain that began tonight. She rates the severity of her pain 10/10. She reports associated SOB and nausea. Pt also presents with lower leg edema. She states that the pain is worse when coughing or deep breathing. Pt states that she has tried hydrocodone PTA with no relief. Pt reports that she was recently DX with PE's. She states that she has been compliant with taking her eloquis. Pt denies vomiting or other related symptoms.    Past Medical History  Diagnosis Date  . Hx of varicella   . Anxiety   . Depression   . Pleurisy   . Gestational diabetes   . Bilateral pulmonary embolism (HCC) 09/03/2015   Past Surgical History  Procedure Laterality Date  . Cholecystectomy    . Cesarean section    . Cesarean section N/A 08/05/2015    Procedure: REPEAT CESAREAN SECTION;  Surgeon: Richarda Overlie, MD;  Location: [facility];  Service: Obstetrics;  Laterality: N/A;  [facility] T to [facility]FA - confirmed edc 08/23/15     Family History  Problem Relation Age of Onset  . Diabetes Father   . Stroke Father   . Hypertension Father   . Heart disease Father   . Cancer Paternal Aunt   . Cancer Paternal Grandmother   . Cancer Paternal Grandfather    Social History  Substance Use Topics  . Smoking status: Former Smoker    Types: Cigarettes    Quit date: 08/05/2007  . Smokeless  tobacco: None  . Alcohol Use: No   OB History    Gravida Para Term Preterm AB TAB SAB Ectopic Multiple Living   0 2      Review of Systems  Cardiovascular: Positive for chest pain.  All other systems reviewed and are negative.    Allergies  Ibuprofen  Home Medications   Prior to Admission medications   Medication Sig Start Date End Date Taking? Authorizing Provider  acetaminophen (TYLENOL) 500 MG tablet Take 500 mg by mouth every 6 (six) hours as needed for mild pain or moderate pain.    Historical Provider, MD  apixaban (ELIQUIS) 5 MG TABS tablet Take 2 tablets 2 times daily for 7 days and then 1 tablet 2 times daily thereafter. Patient taking differently: Take 10 mg by mouth 2 (two) times daily. Take 2 tablets 2 times daily for 7 days and then 1 tablet 2 times daily thereafter. 09/04/15   Elliot Cousin, MD  glimepiride (AMARYL) 1 MG tablet Take 1 tablet daily as needed for blood sugar greater than 150. 09/04/15   Elliot Cousin, MD   BP 123/74 mmHg  Pulse 128  Temp(Src) 99.4 F (37.4 C) (Oral)  Resp 20  Ht  (1.626 m)  Wt 248 lb (112.492 kg)  BMI 42.55 kg/m2  SpO2 99%  LMP  08/10/2015 (Approximate)   Physical Exam  Constitutional: She is oriented to person, place, and time. She appears well-developed and well-nourished. No distress.  HENT:  Head: Normocephalic and atraumatic.  Eyes: Conjunctivae and EOM are normal. Pupils are equal, round, and reactive to light.  Neck: Normal range of motion. Neck supple. No JVD present.  Cardiovascular: Regular rhythm and normal heart sounds.  Tachycardia present.   No murmur heard. Pulmonary/Chest: Effort normal and breath sounds normal. She has no wheezes. She has no rales.  Moderate tenderness anterior chest wall.  Abdominal: Soft. Bowel sounds are normal. She exhibits no distension and no mass. There is no tenderness.  Musculoskeletal: Normal range of motion. She exhibits edema.  1+ Pitting edema bilaterally.    Lymphadenopathy:    She has no cervical adenopathy.  Neurological: She is alert and oriented to person, place, and time. No cranial nerve deficit. She exhibits normal muscle tone. Coordination normal.  Skin: Skin is warm and dry. No rash noted.  Psychiatric: She has a normal mood and affect. Her behavior is normal. Judgment and thought content normal.  Nursing note and vitals reviewed.   ED Course  Procedures (including critical care time) DIAGNOSTIC STUDIES: Oxygen Saturation is 99% on RA, normal by my interpretation.    COORDINATION OF CARE: 11:52 PM-Discussed treatment plan which includes EKG with pt at bedside and pt agreed to plan.     Labs Review Results for orders placed or performed during the hospital encounter of 09/06/15  Basic metabolic panel  Result Value Ref Range   Sodium 136 135 - 145 mmol/L   Potassium 3.9 3.5 - 5.1 mmol/L   Chloride 102 101 - 111 mmol/L   CO2 25 22 - 32 mmol/L   Glucose, Bld 123 (H) 65 - 99 mg/dL   BUN 8 6 - 20 mg/dL   Creatinine, Ser 1.610.70 0.44 - 1.00 mg/dL   Calcium 9.9 8.9 - 09.610.3 mg/dL   GFR calc non Af Amer >60 >60 mL/min   GFR calc Af Amer >60 >60 mL/min   Anion gap 9 5 - 15  CBC  Result Value Ref Range   WBC 7.2 4.0 - 10.5 K/uL   RBC 3.96 3.87 - 5.11 MIL/uL   Hemoglobin 11.5 (L) 12.0 - 15.0 g/dL   HCT 04.535.7 (L) 40.936.0 - 81.146.0 %   MCV 90.2 78.0 - 100.0 fL   MCH 29.0 26.0 - 34.0 pg   MCHC 32.2 30.0 - 36.0 g/dL   RDW 91.413.5 78.211.5 - 95.615.5 %   Platelets 269 150 - 400 K/uL  Hepatic function panel  Result Value Ref Range   Total Protein 6.3 (L) 6.5 - 8.1 g/dL   Albumin 3.7 3.5 - 5.0 g/dL   AST 27 15 - 41 U/L   ALT 42 14 - 54 U/L   Alkaline Phosphatase 69 38 - 126 U/L   Total Bilirubin 0.4 0.3 - 1.2 mg/dL   Bilirubin, Direct <2.1<0.1 (L) 0.1 - 0.5 mg/dL   Indirect Bilirubin NOT CALCULATED 0.3 - 0.9 mg/dL  Differential  Result Value Ref Range   Neutrophils Relative % 77 %   Neutro Abs 5.5 1.7 - 7.7 K/uL   Lymphocytes Relative 18 %    Lymphs Abs 1.3 0.7 - 4.0 K/uL   Monocytes Relative 4 %   Monocytes Absolute 0.3 0.1 - 1.0 K/uL   Eosinophils Relative 1 %   Eosinophils Absolute 0.1 0.0 - 0.7 K/uL   Basophils Relative 0 %   Basophils Absolute  0.0 0.0 - 0.1 K/uL  I-stat troponin, ED  Result Value Ref Range   Troponin i, poc 0.00 0.00 - 0.08 ng/mL   Comment 3          I-Stat CG4 Lactic Acid, ED  Result Value Ref Range   Lactic Acid, Venous 2.70 (HH) 0.5 - 1.9 mmol/L   Comment NOTIFIED PHYSICIAN   I-Stat CG4 Lactic Acid, ED  Result Value Ref Range   Lactic Acid, Venous 1.27 0.5 - 1.9 mmol/L   Imaging Review Dg Chest 2 View  09/07/2015  CLINICAL DATA:  Chest pain, recent surgery. Postpartum. History of pulmonary embolism, diffuse chest pain. EXAM: CHEST  2 VIEW COMPARISON:  CT chest September 03, 2015 FINDINGS: Small to moderate RIGHT pleural effusion with underlying consolidation. Strandy densities LEFT lung base. Cardiomediastinal silhouette appears normal. No pneumothorax. Soft tissue planes and included osseous structure nonsuspicious. Surgical clips in the included right abdomen compatible with cholecystectomy. IMPRESSION: Small to moderate RIGHT pleural effusion and underlying consolidation ; given history of pulmonary embolism, infarct is possible. LEFT lung base atelectasis . Electronically Signed   By: Awilda Metroourtnay  Bloomer M.D.   On: 09/07/2015 01:12   I have personally reviewed and evaluated these lab results as part of my medical decision-making.   EKG Interpretation   Date/Time:  Tuesday September 06 2015 23:23:26 EDT Ventricular Rate:  128 PR Interval:  150 QRS Duration: 72 QT Interval:  298 QTC Calculation: 435 R Axis:   59 Text Interpretation:  Sinus tachycardia Left atrial enlargement Borderline  ECG When compared with ECG of 09/03/2015, No significant change was found  Confirmed by [facility]  MD, Amandajo Gonder ([id number]) on 09/06/2015 11:43:07 PM      MDM   Final diagnoses:  Other acute pulmonary embolism without acute  cor pulmonale (HCC)  Pleuritic chest pain  Pleural effusion, right  Elevated lactic acid level      Pleuritic chest pain with recent diagnosis of pulmonary embolus in. Review of old records confirms recent hospitalization for pulmonary emboli. Precipitating factor was pregnancy. Hypercoagulable workup was negative. Chest x-ray shows probable small pulmonary infarct with pleural effusion. However, she is maintaining adequate blood pressure and adequate oxygen saturations. Initial lactic acid was mildly elevated but has come down with IV hydration. Main issue seems to be pain control. She has been taking hydrocodone-acetaminophen without relief. She will be given a trial of vomiting oxycodone. She is requesting liquid since she states she has difficulty swallowing pills. Recommended follow-up with PCP in 5 days.   I personally performed the services described in this documentation, which was scribed in my presence. The recorded information has been reviewed and is accurate.       Dione Boozeavid Erlene Devita, MD 09/07/15 [phone]

## 2015-09-06 NOTE — ED Notes (Signed)
Spoke with main lab, they will add on differential and hepatic function panel.

## 2015-09-06 NOTE — ED Notes (Addendum)
Presents with right sided chest pain and headache-recently diagnosed with pulmonary embolus, taking eloquis at home. Began having worsening pain with pain medication. Pt is tachycardiac-post C-section 4 weeks ago.

## 2015-09-07 ENCOUNTER — Emergency Department (HOSPITAL_COMMUNITY): Payer: 59

## 2015-09-07 LAB — BASIC METABOLIC PANEL
Anion gap: 9 (ref 5–15)
BUN: 8 mg/dL (ref 6–20)
CHLORIDE: 102 mmol/L (ref 101–111)
CO2: 25 mmol/L (ref 22–32)
Calcium: 9.9 mg/dL (ref 8.9–10.3)
Creatinine, Ser: 0.7 mg/dL (ref 0.44–1.00)
GFR calc Af Amer: >60 mL/min (ref >60)
GLUCOSE: 123 mg/dL — AB (ref 65–99)
POTASSIUM: 3.9 mmol/L (ref 3.5–5.1)
Sodium: 136 mmol/L (ref 135–145)

## 2015-09-07 LAB — CBC
HEMATOCRIT: 35.7 % — AB (ref 36.0–46.0)
Hemoglobin: 11.5 g/dL — ABNORMAL LOW (ref 12.0–15.0)
MCH: 29 pg (ref 26.0–34.0)
MCHC: 32.2 g/dL (ref 30.0–36.0)
MCV: 90.2 fL (ref 78.0–100.0)
Platelets: 269 10*3/uL (ref 150–400)
RBC: 3.96 MIL/uL (ref 3.87–5.11)
RDW: 13.5 % (ref 11.5–15.5)
WBC: 7.2 10*3/uL (ref 4.0–10.5)

## 2015-09-07 LAB — DIFFERENTIAL
Basophils Absolute: 0 10*3/uL (ref 0.0–0.1)
Basophils Relative: 0 %
EOS ABS: 0.1 10*3/uL (ref 0.0–0.7)
EOS PCT: 1 %
LYMPHS ABS: 1.3 10*3/uL (ref 0.7–4.0)
Lymphocytes Relative: 18 %
MONO ABS: 0.3 10*3/uL (ref 0.1–1.0)
MONOS PCT: 4 %
NEUTROS PCT: 77 %
Neutro Abs: 5.5 10*3/uL (ref 1.7–7.7)

## 2015-09-07 LAB — HEPATIC FUNCTION PANEL
ALBUMIN: 3.7 g/dL (ref 3.5–5.0)
ALK PHOS: 69 U/L (ref 38–126)
ALT: 42 U/L (ref 14–54)
AST: 27 U/L (ref 15–41)
BILIRUBIN TOTAL: 0.4 mg/dL (ref 0.3–1.2)
Total Protein: 6.3 g/dL — ABNORMAL LOW (ref 6.5–8.1)

## 2015-09-07 LAB — I-STAT TROPONIN, ED: TROPONIN I, POC: 0 ng/mL (ref 0.00–0.08)

## 2015-09-07 LAB — I-STAT CG4 LACTIC ACID, ED
LACTIC ACID, VENOUS: 2.7 mmol/L — AB (ref 0.5–1.9)
Lactic Acid, Venous: 1.27 mmol/L (ref 0.5–1.9)

## 2015-09-07 MED ORDER — OXYCODONE HCL 5 MG/5ML PO SOLN: 5.0000 mg | ORAL | Status: DC | PRN

## 2015-09-07 MED ORDER — MORPHINE SULFATE (PF) 4 MG/ML IV SOLN
4.0000 mg | Freq: Once | INTRAVENOUS | Status: AC
  Administered 2015-09-07: 4 mg via INTRAVENOUS
  Filled 2015-09-07: qty 1

## 2015-09-07 NOTE — Discharge Instructions (Signed)
Tried taking oxycodone one-2 teaspoons full every 4 hours as needed for pain. You may take acetaminophen along with that. Return if you develop fever or pain is not being adequately controlled.  Pulmonary Embolism A pulmonary embolism (PE) is a sudden blockage or decrease of blood flow in one lung or both lungs. Most blockages come from a blood clot that travels from the legs or the pelvis to the lungs. PE is a dangerous and potentially life-threatening condition if it is not treated right away. CAUSES A pulmonary embolism occurs most commonly when a blood clot travels from one of your veins to your lungs. Rarely, PE is caused by air, fat, amniotic fluid, or part of a tumor traveling through your veins to your lungs. RISK FACTORS A PE is more likely to develop in:  People who smoke.  People who areolder, especially over 26 years of age.  People who are overweight (obese).  People who sit or lie still for a long time, such as during long-distance travel (over 4 hours), bed rest, hospitalization, or during recovery from certain medical conditions like a stroke.  People who do not engage in much physical activity (sedentary lifestyle).  People who have chronic breathing disorders.  People whohave a personal or family history of blood clots or blood clotting disease.  People whohave peripheral vascular disease (PVD), diabetes, or some types of cancer.  People who haveheart disease, especially if the person had a recent heart attack or has congestive heart failure.  People who have neurological diseases that affect the legs (leg paresis).  People who have had a traumatic injury, such as breaking a hip or leg.  People whohave recently had major or lengthy surgery, especially on the hip, knee, or abdomen.  People who have hada central line placed inside a large vein.  People who takemedicines that contain the hormone estrogen. These include birth control pills and hormone  replacement therapy.  Pregnancy or during childbirth or the postpartum period. SIGNS AND SYMPTOMS  The symptoms of a PE usually start suddenly and include:  Shortness of breath while active or at rest.  Coughing or coughing up blood or blood-tinged mucus.  Chest pain that is often worse with deep breaths.  Rapid or irregular heartbeat.  Feeling light-headed or dizzy.  Fainting.  Feelinganxious.  Sweating. There may also be pain and swelling in a leg if that is where the blood clot started. These symptoms may represent a serious problem that is an emergency. Do not wait to see if the symptoms will go away. Get medical help right away. Call your local emergency services (911 in the [place] Do not drive yourself to the hospital. DIAGNOSIS Your health care provider will take a medical history and perform a physical exam. You may also have other tests, including:  Blood tests to assess the clotting properties of your blood, assess oxygen levels in your blood, and find blood clots.  Imaging tests, such as CT, ultrasound, MRI, X-ray, and other tests to see if you have clots anywhere in your body.  An electrocardiogram (ECG) to look for heart strain from blood clots in the lungs. TREATMENT The main goals of PE treatment are:  To stop a blood clot from growing larger.  To stop new blood clots from forming. The type of treatment that you receive depends on many factors, such as the cause of your PE, your risk for bleeding or developing more clots, and other medical conditions that you have. Sometimes, a combination of  treatments is necessary. This condition may be treated with:  Medicines, including newer oral blood thinners (anticoagulants), warfarin, low molecular weight heparins, thrombolytics, or heparins.  Wearing compression stockings or using different types of devices.  Surgery (rare) to remove the blood clot or to place a filter in your abdomen to stop the blood clot from  traveling to your lungs. Treatments for a PE are often divided into immediate treatment, long-term treatment (up to 3 months after PE), and extended treatment (more than 3 months after PE). Your treatment may continue for several months. This is called maintenance therapy, and it is used to prevent the forming of new blood clots. You can work with your health care provider to choose the treatment program that is best for you. What are anticoagulants? Anticoagulants are medicines that treat PEs. They can stop current blood clots from growing and stop new clots from forming. They cannot dissolve existing clots. Your body dissolves clots by itself over time. Anticoagulants are given by mouth, by injection, or through an IV tube. What are thrombolytics? Thrombolytics are clot-dissolving medicines that are used to dissolve a PE. They carry a high risk of bleeding, so they tend to be used only in severe cases or if you have very low blood pressure. HOME CARE INSTRUCTIONS If you are taking a newer oral anticoagulant:  Take the medicine every single day at the same time each day.  Understand what foods and drugs interact with this medicine.  Understand that there are no regular blood tests required when using this medicine.  Understandthe side effects of this medicine, including excessive bruising or bleeding. Ask your health care provider or pharmacist about other possible side effects. If you are taking warfarin:  Understand how to take warfarin and know which foods can affect how warfarin works in [employer] account executive.  Understand that it is dangerous to taketoo much or too little warfarin. Too much warfarin increases the risk of bleeding. Too little warfarin continues to allow the risk for blood clots.  Follow your PT and INR blood testing schedule. The PT and INR results allow your health care provider to adjust your dose of warfarin. It is very important that you have your PT and INR tested as often as  told by your health care provider.  Avoid major changes in your diet, or tell your health care provider before you change your diet. Arrange a visit with a registered dietitian to answer your questions. Many foods, especially foods that are high in vitamin K, can interfere with warfarin and affect the PT and INR results. Eat a consistent amount of foods that are high in vitamin K, such as:  Spinach, kale, broccoli, cabbage, collard greens, turnip greens, Brussels sprouts, peas, cauliflower, seaweed, and parsley.  Beef liver and pork liver.  Green tea.  Soybean oil.  Tell your health care provider about any and all medicines, vitamins, and supplements that you take, including aspirin and other over-the-counter anti-inflammatory medicines. Be especially cautious with aspirin and anti-inflammatory medicines. Do not take those before you ask your health care provider if it is safe to do so. This is important because many medicines can interfere with warfarin and affect the PT and INR results.  Do not start or stop taking any over-the-counter or prescription medicine unless your health care provider or pharmacist tells you to do so. If you take warfarin, you will also need to do these things:  Hold pressure over cuts for longer than usual.  Tell your dentist  and other health care providers that you are taking warfarin before you have any procedures in which bleeding may occur.  Avoid alcohol or drink very small amounts. Tell your health care provider if you change your alcohol intake.  Do not use tobacco products, including cigarettes, chewing tobacco, and e-cigarettes. If you need help quitting, ask your health care provider.  Avoid contact sports. General Instructions  Take over-the-counter and prescription medicines only as told by your health care provider. Anticoagulant medicines can have side effects, including easy bruising and difficulty stopping bleeding. If you are prescribed an  anticoagulant, you will also need to do these things:  Hold pressure over cuts for longer than usual.  Tell your dentist and other health care providers that you are taking anticoagulants before you have any procedures in which bleeding may occur.  Avoid contact sports.  Wear a medical alert bracelet or carry a medical alert card that says you have had a PE.  Ask your health care provider how soon you can go back to your normal activities. Stay active to prevent new blood clots from forming.  Make sure to exercise while traveling or when you have been sitting or standing for a long period of time. It is very important to exercise. Exercise your legs by walking or by tightening and relaxing your leg muscles often. Take frequent walks.  Wear compression stockings as told by your health care provider to help prevent more blood clots from forming.  Do not use tobacco products, including cigarettes, chewing tobacco, and e-cigarettes. If you need help quitting, ask your health care provider.  Keep all follow-up appointments with your health care provider. This is important. PREVENTION Take these actions to decrease your risk of developing another PE:  Exercise regularly. For at least 30 minutes every day, engage in:  Activity that involves moving your arms and legs.  Activity that encourages good blood flow through your body by increasing your heart rate.  Exercise your arms and legs every hour during long-distance travel (over 4 hours). Drink plenty of water and avoid drinking alcohol while traveling.  Avoid sitting or lying in bed for long periods of time without moving your legs.  Maintain a weight that is appropriate for your height. Ask your health care provider what weight is healthy for you.  If you are a woman who is over 26 years of age, avoid unnecessary use of medicines that contain estrogen. These include birth control pills.  Do not smoke, especially if you take estrogen  medicines. If you need help quitting, ask your health care provider.  If you are at very high risk for PE, wear compression stockings.  If you recently had a PE, have regularly scheduled ultrasound testing on your legs to check for new blood clots. If you are hospitalized, prevention measures may include:  Early walking after surgery, as soon as your health care provider says that it is safe.  Receiving anticoagulants to prevent blood clots. If you cannot take anticoagulants, other options may be available, such as wearing compression stockings or using different types of devices. SEEK IMMEDIATE MEDICAL CARE IF:  You have new or increased pain, swelling, or redness in an arm or leg.  You have numbness or tingling in an arm or leg.  You have shortness of breath while active or at rest.  You have chest pain.  You have a rapid or irregular heartbeat.  You feel light-headed or dizzy.  You cough up blood.  You notice  blood in your vomit, bowel movement, or urine.  You have a fever. These symptoms may represent a serious problem that is an emergency. Do not wait to see if the symptoms will go away. Get medical help right away. Call your local emergency services (911 in the [place] Do not drive yourself to the hospital.   This information is not intended to replace advice given to you by your health care provider. Make sure you discuss any questions you have with your health care provider.   Document Released: 02/24/2000 Document Revised: 11/17/2014 Document Reviewed: 06/23/2014 Elsevier Interactive Patient Education 2016 [employer].  Oxycodone oral solution What is this medicine? OXYCODONE (ox i KOE done) is a pain reliever. It is used to treat moderate to severe pain. This medicine may be used for other purposes; ask your health care provider or pharmacist if you have questions. What should I tell my health care provider before I take this medicine? They need to know if you  have any of these conditions: -Addison's disease -brain tumor -head injury -heart disease -history of drug or alcohol abuse problem -if you often drink alcohol -kidney disease -liver disease -lung or breathing disease, like asthma -mental illness -pancreatic disease -seizures -stomach or intestine problems -thyroid disease -an unusual or allergic reaction to latex, oxycodone, codeine, hydrocodone, morphine, other medicines, foods, dyes, or preservatives -pregnant or trying to get pregnant -breast-feeding How should I use this medicine? Take this medicine by mouth. Use a specially marked spoon or dropper to measure your dose. Ask your pharmacist if you do not have one. Do not use a household spoon. If you are allergic to latex or rubber, do not use the dropper that comes with the product. If the medicine upsets your stomach, take it with food or milk. You may mix the medicine with food or juice if you will immediately eat or drink. Do not store food or drink with medicine added. Follow the directions on the prescription label. Do not take more than you are told to take. A special MedGuide will be given to you by the pharmacist with each prescription and refill. Be sure to read this information carefully each time. Talk to your pediatrician regarding the use of this medicine in children. Special care may be needed. Overdosage: If you think you have taken too much of this medicine contact a poison control center or emergency room at once. NOTE: This medicine is only for you. Do not share this medicine with others. What if I miss a dose? If you miss a dose, take it as soon as you can. If it is almost time for your next dose, take only that dose. Do not take double or extra doses. What may interact with this medicine? -alcohol -antihistamines -certain medicines used for nausea like chlorpromazine, droperidol -erythromycin -ketoconazole -medicines for depression, anxiety, or psychotic  disturbances -medicines for sleep -muscle relaxants -naloxone -naltrexone -narcotic medicines (opiates) for pain -nilotinib -phenobarbital -phenytoin -rifampin -ritonavir -voriconazole This list may not describe all possible interactions. Give your health care provider a list of all the medicines, herbs, non-prescription drugs, or dietary supplements you use. Also tell them if you smoke, drink alcohol, or use illegal drugs. Some items may interact with your medicine. What should I watch for while using this medicine? Tell your doctor or health care professional if your pain does not go away, if it gets worse, or if you have new or a different type of pain. You may develop tolerance to the medicine.  Tolerance means that you will need a higher dose of the medicine for pain relief. Tolerance is normal and is expected if you take this medicine for a long time. Do not suddenly stop taking your medicine because you may develop a severe reaction. Your body becomes used to the medicine. This does NOT mean you are addicted. Addiction is a behavior related to getting and using a drug for a non-medical reason. If you have pain, you have a medical reason to take pain medicine. Your doctor will tell you how much medicine to take. If your doctor wants you to stop the medicine, the dose will be slowly lowered over time to avoid any side effects. You may get drowsy or dizzy when you first start taking this medicine or change doses. Do not drive, use machinery, or do anything that may be dangerous until you know how the medicine affects you. Stand or sit up slowly. There are different types of narcotic medicines (opiates) for pain. If you take more than one type at the same time, you may have more side effects. Give your health care provider a list of all medicines you use. Your doctor will tell you how much medicine to take. Do not take more medicine than directed. Call emergency for help if you have problems  breathing. This medicine will cause constipation. Try to have a bowel movement at least every 2 to 3 days. If you do not have a bowel movement for 3 days, call your doctor or health care professional. Your mouth may get dry. Drinking water, chewing sugarless gum, or sucking on hard candy may help. See your dentist every 6 months. What side effects may I notice from receiving this medicine? Side effects that you should report to your doctor or health care professional as soon as possible: -allergic reactions like skin rash, itching or hives, swelling of the face, lips, or tongue -breathing problems -confusion -feeling faint or lightheaded, falls -trouble passing urine or change in the amount of urine -unusually weak or tired Side effects that usually do not require medical attention (report to your doctor or health care professional if they continue or are bothersome): -constipation -dry mouth -itching -nausea, vomiting -upset stomach This list may not describe all possible side effects. Call your doctor for medical advice about side effects. You may report side effects to FDA at 1-800-FDA-1088. Where should I keep my medicine? Keep out of the reach of children. This medicine can be abused. Keep your medicine in a safe place to protect it from theft. Do not share this medicine with anyone. Selling or giving away this medicine is dangerous and against the law. Store at room temperature between 15 and 30 degrees C (59 and 86 degrees F). Protect from light. Keep container tightly closed. This medicine may cause accidental overdose and death if it is taken by other adults, children, or pets. Flush any unused medicine down the toilet to reduce the chance of harm. Do not use the medicine after the expiration date. NOTE: This sheet is a summary. It may not cover all possible information. If you have questions about this medicine, talk to your doctor, pharmacist, or health care provider.    2016,  Elsevier/Gold Standard. (2014-07-10 01:09:49)

## 2015-09-08 LAB — PROTHROMBIN GENE MUTATION

## 2015-09-12 LAB — FACTOR 5 LEIDEN

## 2015-09-15 ENCOUNTER — Ambulatory Visit (INDEPENDENT_AMBULATORY_CARE_PROVIDER_SITE_OTHER)
Admission: RE | Admit: 2015-09-15 | Discharge: 2015-09-15 | Disposition: A | Discharge status: Home / Self Care | Payer: 59 | Source: Ambulatory Visit | Attending: Internal Medicine | Admitting: Internal Medicine

## 2015-09-15 ENCOUNTER — Ambulatory Visit (INDEPENDENT_AMBULATORY_CARE_PROVIDER_SITE_OTHER): Payer: 59 | Admitting: Internal Medicine

## 2015-09-15 ENCOUNTER — Encounter: Payer: Self-pay | Admitting: Internal Medicine

## 2015-09-15 VITALS — BP 112/70 | HR 88 | Ht 64.0 in | Wt 244.8 lb

## 2015-09-15 DIAGNOSIS — J90 Pleural effusion, not elsewhere classified: Secondary | ICD-10-CM | POA: Insufficient documentation

## 2015-09-15 DIAGNOSIS — J948 Other specified pleural conditions: Secondary | ICD-10-CM

## 2015-09-15 DIAGNOSIS — I2699 Other pulmonary embolism without acute cor pulmonale: Secondary | ICD-10-CM

## 2015-09-15 MED ORDER — MELOXICAM 7.5 MG PO TABS: 7.5000 mg | ORAL_TABLET | Freq: Every day | ORAL | Status: DC

## 2015-09-15 NOTE — Patient Instructions (Addendum)
Please remember to go to the  x-ray department downstairs for your tests - we will call you with the results when they are available.  Try mobic 7.5 mg with breakfast to see if reduces your need for the hydrocodone but this should gradually improve over the next few weeks  You will need to stay on eliquis about 6 months and before it gets stopped you will need venous dopplers of both legs and no further scans or studies as long as you feel back to normal  Late add: small / mod R effusion > recheck cxr in 2 weeks

## 2015-09-15 NOTE — Progress Notes (Signed)
Subjective:     Patient ID: Brianna Russell, female   DOB: 03/20/1990,    MRN: [id number]  HPI  Admit date: 09/03/2015 Discharge date: 09/04/2015     Discharge Diagnoses:  1. Bilateral pulmonary emboli 2. Pleuritic chest pain secondary to #1. Resolved. 3. Recent gestational diabetes with mild hyperglycemia during the hospitalization. 4. Morbid obesity. 5. Normocytic anemia. 6. Transient bradycardia while asleep.    Filed Weights   09/03/15 0423  Weight: 112.492 kg (248 lb)    History of present illness:  Patient is a 25 y.o. female with medical history significant for a recent pregnancy/delivery with C-section on 08/05/2015, obesity, and depression, who presents to the ED with a chief complaint of shortness of breath and right-sided chest pain.  In the ED, she was afebrile and hemodynamically stable. She was oxygenating in the upper 90s on room air. Her lab data were significant for an elevated d-dimer of 2.79, normal troponin I, and hemoglobin of 11.5. CT angiogram of the chest revealed acute subsegmental pulmonary emboli in the bilateral lower lobes; no CT evidence of pulmonary hypertension or right heart strain; trace right pleural effusion; mosaic attenuation in the lower lobes, a nonspecific finding. She was admitted for evaluation and management of PE.  Hospital Course:  Patient reported no known family history of blood clots. She had not been taking OCA's. She denied any recent travel. She was not a smoker. Patient was started on IV Lovenox. Supportive treatment was given with as needed analgesics for pain. Gentle IV fluids were given. Oxygen was applied as needed for comfort. She was not hypoxic. Follow-up blood work revealed a mild leukocytosis, but with no fever and no evidence of an active infection. Hemoglobin fell slightly from 11.5-10.6 which was likely dilutional in the setting of recent blood loss from C-section delivery. Her venous glucose ranged from 135-159. She  reported a history of gestational diabetes which was treated with an oral agent during her pregnancy. Apparently, she had stopped taking it. Insulin was not given during this hospitalization, but she was discharged with a prescription for Amaryl 1 mg when necessary for CBGs greater than 150. She was instructed to check her blood sugars if not daily, several days per week.  Patient's pleuritic chest pain resolved. Eliquis was started for outpatient anticoagulation management when Lovenox was discontinued. She was discharged with instructions for dosing. She voiced understanding. The etiology of her pulmonary emboli was unknown. Her risk factors included obesity and C-section delivery 4 weeks ago.Hypercoagulable panel was ordered for further evaluation. The results were pending at the time of discharge. The dictating physician will review the results when available and inform the patient accordingly.      09/15/2015 1st [facility] office visit/ Adalyne Lovick  S/p PE onset 08/19/15 on anticoag since 08/31/15  Chief Complaint  Patient presents with  . Pulmonary Consult    Referred by Dr. Harold HedgeJames Tomblin. Pt c/o DOE, right side pain and non prod cough x 3 wks. She was dxed with PE on 09/03/15 and started on Eliquis.   at onset cp couldn't lie back at all now can lie down on Left side and no pain but still not on back or R side Still taking hydrocodone 10 mg twice daily on avg for R cp, no new areas of pain since dx and sob is improving gradually   No obvious day to day or daytime variability or assoc excess/ purulent sputum or mucus plugs or hemoptysis or   subjective wheeze or  overt sinus or hb symptoms. No unusual exp hx or h/o childhood pna/ asthma or knowledge of premature birth.  Sleeping ok without nocturnal  or early am exacerbation  of respiratory  c/o's or need for noct saba. Also denies any obvious fluctuation of symptoms with weather or environmental changes or other aggravating or alleviating factors  except as outlined above   Current Medications, Allergies, Complete Past Medical History, Past Surgical History, Family History, and Social History were reviewed in [facility] electronic medical record.  ROS  The following are not active complaints unless bolded sore throat, dysphagia, dental problems, itching, sneezing,  nasal congestion or excess/ purulent secretions, ear ache,   fever, chills, sweats, unintended wt loss, classically   exertional cp,  orthopnea pnd or leg swelling, presyncope, palpitations, abdominal pain, anorexia, nausea, vomiting, diarrhea  or change in bowel or bladder habits, change in stools or urine, dysuria,hematuria,  rash, arthralgias, visual complaints, headache, numbness, weakness or ataxia or problems with walking or coordination,  change in mood/affect or memory.vag bleeding improved last few days prior to OV         Review of Systems     Objective:   Physical Exam Obese amb wf nad  Wt Readings from Last 3 Encounters:  09/15/15 244 lb 12.8 oz (111.041 kg)  09/06/15 248 lb (112.492 kg)  09/04/15 248 lb (112.492 kg)    Vital signs reviewed     HEENT: nl dentition, turbinates, and oropharynx. Nl external ear canals without cough reflex   NECK :  without JVD/Nodes/TM/ nl carotid upstrokes bilaterally   LUNGS: no acc muscle use,  Nl contour chest with decreased bs/ dullness R base - no def rub   CV:  RRR  no s3 or murmur or increase in P2, no edema   ABD:  soft and nontender with nl inspiratory excursion in the supine position. No bruits or organomegaly, bowel sounds nl  MS:  Nl gait/ ext warm without deformities, calf tenderness, cyanosis or clubbing No obvious joint restrictions   SKIN: warm and dry without lesions    NEURO:  alert, approp, nl sensorium with  no motor deficits     CXR PA and Lateral:   09/15/2015 :    I personally reviewed images and agree with radiology impression as follows:   1. Persistent small to moderate size right  pleural effusion with right basilar atelectasis. 2. Minimal left basilar linear atelectasis.    Lab Results  Component Value Date   HGB 11.5* 09/06/2015   HGB 10.5* 09/04/2015   HGB 10.6* 09/04/2015         Assessment:

## 2015-09-15 NOTE — Assessment & Plan Note (Signed)
She clearly has signficant pleuritis from PE/ infarct and may have a small R hemothorax but note hgb trended up prior to d/c and should be able to re-absorb this effusion over next few weeks s intervention  I did rec mobic for the ongoing inflammatory component and advised weaning off narcs now if possible

## 2015-09-15 NOTE — Assessment & Plan Note (Signed)
Body mass index is 42   No results found for: TSH   Clearly partly related to wt gain from pregnancy but she tells me this was a problem pre-pregancy as well following her previous pregnancy as never able to lose the wt she had gained   Contributing to clot risk/ doe/reviewed the need and the process to achieve and maintain neg calorie balance > defer f/u primary care including intermittently monitoring thyroid status

## 2015-09-15 NOTE — Assessment & Plan Note (Addendum)
C section Aug 05 2015 and onset of pain one week later localized pleuritic R  - CTa 08/19/15  Neg for PE  - CTa 09/03/15  Pos multiple pe's/ no venous dopplers done - Anticoag profile 09/03/15 neg x minimal elevation lupus anticagulant with neg fm hx also - Added mobic 7.5 mg daily prn pleuritic cp   There are 5 different syndromes from PE and she has one the two that are hardest to dx. The first is obviously the asymptomatic pe, but the second is the small peripheral type that causes infarcts/ pleurisy/pleural effusion.  The best rx for the latter is NSAIDs but most are contraindicated in pts on anticoagulation with the least effect on plts by mobic so rec try 7.5 mg with meals and see if lessens need for narcs  In terms of cause, likely from C section/small pelvic veins  but needs venous dopplers at 6 months prior to stopping and no further ct's/v/qs or echo's as long as fully recovers ex tolerance.  Also advised on wt loss  Total time devoted to counseling  = 35/7493m review case with pt/ discussion of options/alternatives/ personally creating written instructions  in presence of pt  then going over those specific  Instructions directly with the pt including how to use all of the meds but in particular covering each new medication in detail and the difference between the maintenance/automatic meds and the prns using an action plan format for the latter.

## 2015-09-15 NOTE — Progress Notes (Signed)
Quick Note:  Spoke with pt and notified of results per Dr. Wert. Pt verbalized understanding and denied any questions.  ______ 

## 2015-09-29 ENCOUNTER — Encounter (INDEPENDENT_AMBULATORY_CARE_PROVIDER_SITE_OTHER): Payer: Self-pay

## 2015-09-29 ENCOUNTER — Ambulatory Visit (INDEPENDENT_AMBULATORY_CARE_PROVIDER_SITE_OTHER): Payer: 59 | Admitting: Internal Medicine

## 2015-09-29 ENCOUNTER — Encounter: Payer: Self-pay | Admitting: Internal Medicine

## 2015-09-29 ENCOUNTER — Ambulatory Visit (INDEPENDENT_AMBULATORY_CARE_PROVIDER_SITE_OTHER)
Admission: RE | Admit: 2015-09-29 | Discharge: 2015-09-29 | Disposition: A | Discharge status: Home / Self Care | Payer: 59 | Source: Ambulatory Visit | Attending: Internal Medicine | Admitting: Internal Medicine

## 2015-09-29 VITALS — BP 128/68 | HR 61 | Ht 64.0 in | Wt 246.6 lb

## 2015-09-29 DIAGNOSIS — I2699 Other pulmonary embolism without acute cor pulmonale: Secondary | ICD-10-CM | POA: Diagnosis not present

## 2015-09-29 DIAGNOSIS — J948 Other specified pleural conditions: Secondary | ICD-10-CM

## 2015-09-29 DIAGNOSIS — J90 Pleural effusion, not elsewhere classified: Secondary | ICD-10-CM

## 2015-09-29 NOTE — Progress Notes (Signed)
Subjective:     Patient ID: Brianna Russell, female   DOB: February 28, 1991    MRN: [id number]  HPI  Admit date: 09/03/2015 Discharge date: 09/04/2015     Discharge Diagnoses:  1. Bilateral pulmonary emboli 2. Pleuritic chest pain secondary to #1. Resolved. 3. Recent gestational diabetes with mild hyperglycemia during the hospitalization. 4. Morbid obesity. 5. Normocytic anemia. 6. Transient bradycardia while asleep.    Filed Weights   09/03/15 0423  Weight: 112.492 kg (248 lb)    History of present illness:  Patient is a 25 y.o. female with medical history significant for a recent pregnancy/delivery with C-section on 08/05/2015, obesity, and depression, who presents to the ED with a chief complaint of shortness of breath and right-sided chest pain.  In the ED, she was afebrile and hemodynamically stable. She was oxygenating in the upper 90s on room air. Her lab data were significant for an elevated d-dimer of 2.79, normal troponin I, and hemoglobin of 11.5. CT angiogram of the chest revealed acute subsegmental pulmonary emboli in the bilateral lower lobes; no CT evidence of pulmonary hypertension or right heart strain; trace right pleural effusion; mosaic attenuation in the lower lobes, a nonspecific finding. She was admitted for evaluation and management of PE.  Hospital Course:  Patient reported no known family history of blood clots. She had not been taking OCA's. She denied any recent travel. She was not a smoker. Patient was started on IV Lovenox. Supportive treatment was given with as needed analgesics for pain. Gentle IV fluids were given. Oxygen was applied as needed for comfort. She was not hypoxic. Follow-up blood work revealed a mild leukocytosis, but with no fever and no evidence of an active infection. Hemoglobin fell slightly from 11.5-10.6 which was likely dilutional in the setting of recent blood loss from C-section delivery. Her venous glucose ranged from 135-159. She  reported a history of gestational diabetes which was treated with an oral agent during her pregnancy. Apparently, she had stopped taking it. Insulin was not given during this hospitalization, but she was discharged with a prescription for Amaryl 1 mg when necessary for CBGs greater than 150. She was instructed to check her blood sugars if not daily, several days per week.  Patient's pleuritic chest pain resolved. Eliquis was started for outpatient anticoagulation management when Lovenox was discontinued. She was discharged with instructions for dosing. She voiced understanding. The etiology of her pulmonary emboli was unknown. Her risk factors included obesity and C-section delivery 4 weeks ago.Hypercoagulable panel was ordered for further evaluation. The results were pending at the time of discharge. The dictating physician will review the results when available and inform the patient accordingly.      09/15/2015 1st [facility] office visit/ Brianna Russell  S/p PE onset 08/19/15 on anticoag since 08/31/15  Chief Complaint  Patient presents with  . Pulmonary Consult    Referred by Dr. Harold Hedge. Pt c/o DOE, right side pain and non prod cough x 3 wks. She was dxed with PE on 09/03/15 and started on Eliquis.   at onset cp couldn't lie back at all now can lie down on Left side and no pain but still not on back or R side Still taking hydrocodone 10 mg twice daily on avg for R cp, no new areas of pain since dx and sob is improving gradually  rec Please remember to go to the  x-ray department downstairs for your tests - we will call you with the results when they are  available. Try mobic 7.5 mg with breakfast to see if reduces your need for the hydrocodone but this should gradually improve over the next few weeks You will need to stay on eliquis about 6 months and before it gets stopped you will need venous dopplers of both legs and no further scans or studies as long as you feel back to normal  Late add:  small / mod R effusion > recheck cxr in 2 weeks    09/29/2015  f/u ov/Brianna Russell re: s/p  PE  Chief Complaint  Patient presents with  . Follow-up    Right side pain and cough have resovled. Her breathing has improved, but not quite back to her normal baseline.   still sob at more than slow pace on flat grade/ hard to do steps  No obvious day to day or daytime variability or assoc excess/ purulent sputum or mucus plugs or hemoptysis or   subjective wheeze or overt sinus or hb symptoms. No unusual exp hx or h/o childhood pna/ asthma or knowledge of premature birth.  Sleeping ok without nocturnal  or early am exacerbation  of respiratory  c/o's or need for noct saba. Also denies any obvious fluctuation of symptoms with weather or environmental changes or other aggravating or alleviating factors except as outlined above   Current Medications, Allergies, Complete Past Medical History, Past Surgical History, Family History, and Social History were reviewed in [facility] electronic medical record.  ROS  The following are not active complaints unless bolded sore throat, dysphagia, dental problems, itching, sneezing,  nasal congestion or excess/ purulent secretions, ear ache,   fever, chills, sweats, unintended wt loss, classically   exertional cp,  orthopnea pnd or leg swelling, presyncope, palpitations, abdominal pain, anorexia, nausea, vomiting, diarrhea  or change in bowel or bladder habits, change in stools or urine, dysuria,hematuria,  rash, arthralgias, visual complaints, headache, numbness, weakness or ataxia or problems with walking or coordination,  change in mood/affect or memory.vag bleeding improved   prior to OV               Objective:   Physical Exam Obese amb wf nad  10/02/2015       247  09/15/15 244 lb 12.8 oz (111.041 kg)  09/06/15 248 lb (112.492 kg)  09/04/15 248 lb (112.492 kg)    Vital signs reviewed     HEENT: nl dentition, turbinates, and oropharynx. Nl external ear  canals without cough reflex   NECK :  without JVD/Nodes/TM/ nl carotid upstrokes bilaterally   LUNGS: no acc muscle use,  Nl contour chest and clear to A and P bilaterally    CV:  RRR  no s3 or murmur or increase in P2, no edema   ABD:  soft and nontender with nl inspiratory excursion in the supine position. No bruits or organomegaly, bowel sounds nl  MS:  Nl gait/ ext warm without deformities, calf tenderness, cyanosis or clubbing No obvious joint restrictions   SKIN: warm and dry without lesions    NEURO:  alert, approp, nl sensorium with  no motor deficits        CXR PA and Lateral:   09/29/2015 :    I personally reviewed images and agree with radiology impression as follows:    Interval decrease in the volume of the right-sided pleural effusion. Only a small volume remains. No pulmonary parenchymal abnormality. No CHF.       Assessment:

## 2015-09-29 NOTE — Patient Instructions (Addendum)
As long as doing great, continue eliquis and we will refill it through December 2017 but we need to see you here before the last prescription runs out to decide on longterm risk of more clotting or the alternative would be to get an opinion from a clotting specialist, Dr Marlena ClipperGrandfortuna

## 2015-10-02 NOTE — Assessment & Plan Note (Signed)
C section Aug 05 2015 and onset of pain one week later localized pleuritic R  - CTa 08/19/15  Neg for PE  - CTa 09/03/15  Pos multiple pe's/ no venous dopplers done - Anticoag profile 09/03/15 neg x minimal elevation lupus anticagulant with neg fm hx also  - Added mobic 7.5 mg daily prn pleuritic cp > resolved by ov 09/29/2015    Adequate control on present rx, reviewed > no change in rx needed  > plan 6 m rx then regroup and work on wt loss in meantime

## 2015-10-02 NOTE — Assessment & Plan Note (Signed)
C/w infarct/ resolving nicely, no further xrays needed

## 2015-10-02 NOTE — Assessment & Plan Note (Signed)
Body mass index is 42.33 kg/m.  No results found for: TSH   Contributing to dvt/pe risk and ongoing doe/reviewed the need and the process to achieve and maintain neg calorie balance > defer f/u primary care including intermittently monitoring thyroid status

## 2016-03-27 DIAGNOSIS — Z86711 Personal history of pulmonary embolism: Secondary | ICD-10-CM | POA: Diagnosis not present

## 2016-03-27 DIAGNOSIS — B349 Viral infection, unspecified: Secondary | ICD-10-CM | POA: Diagnosis not present

## 2016-03-31 DIAGNOSIS — J069 Acute upper respiratory infection, unspecified: Secondary | ICD-10-CM | POA: Diagnosis not present

## 2016-09-04 DIAGNOSIS — R7301 Impaired fasting glucose: Secondary | ICD-10-CM | POA: Diagnosis not present

## 2016-09-04 DIAGNOSIS — N912 Amenorrhea, unspecified: Secondary | ICD-10-CM | POA: Diagnosis not present

## 2016-09-04 DIAGNOSIS — M6208 Separation of muscle (nontraumatic), other site: Secondary | ICD-10-CM | POA: Diagnosis not present

## 2016-09-15 ENCOUNTER — Emergency Department (HOSPITAL_COMMUNITY): Payer: 59

## 2016-09-15 ENCOUNTER — Encounter (HOSPITAL_COMMUNITY): Payer: Self-pay | Admitting: Emergency Medicine

## 2016-09-15 DIAGNOSIS — K921 Melena: Secondary | ICD-10-CM | POA: Insufficient documentation

## 2016-09-15 DIAGNOSIS — R079 Chest pain, unspecified: Secondary | ICD-10-CM | POA: Diagnosis not present

## 2016-09-15 DIAGNOSIS — R0600 Dyspnea, unspecified: Secondary | ICD-10-CM | POA: Diagnosis not present

## 2016-09-15 DIAGNOSIS — R0789 Other chest pain: Secondary | ICD-10-CM | POA: Insufficient documentation

## 2016-09-15 DIAGNOSIS — R072 Precordial pain: Secondary | ICD-10-CM | POA: Diagnosis not present

## 2016-09-15 LAB — CBC
HCT: 44.2 % (ref 36.0–46.0)
Hemoglobin: 15.1 g/dL — ABNORMAL HIGH (ref 12.0–15.0)
MCH: 31.1 pg (ref 26.0–34.0)
MCHC: 34.2 g/dL (ref 30.0–36.0)
MCV: 91.1 fL (ref 78.0–100.0)
PLATELETS: 232 10*3/uL (ref 150–400)
RBC: 4.85 MIL/uL (ref 3.87–5.11)
RDW: 12.8 % (ref 11.5–15.5)
WBC: 9.3 10*3/uL (ref 4.0–10.5)

## 2016-09-15 LAB — BASIC METABOLIC PANEL
Anion gap: 11 (ref 5–15)
BUN: 7 mg/dL (ref 6–20)
CHLORIDE: 103 mmol/L (ref 101–111)
CO2: 22 mmol/L (ref 22–32)
CREATININE: 0.75 mg/dL (ref 0.44–1.00)
Calcium: 9.5 mg/dL (ref 8.9–10.3)
GFR calc non Af Amer: >60 mL/min (ref >60)
Glucose, Bld: 117 mg/dL — ABNORMAL HIGH (ref 65–99)
Potassium: 3.5 mmol/L (ref 3.5–5.1)
SODIUM: 136 mmol/L (ref 135–145)

## 2016-09-15 LAB — I-STAT BETA HCG BLOOD, ED (MC, WL, AP ONLY)

## 2016-09-15 LAB — I-STAT TROPONIN, ED: TROPONIN I, POC: 0.01 ng/mL (ref 0.00–0.08)

## 2016-09-15 NOTE — ED Triage Notes (Signed)
Patient reports central chest pain with mild SOB and nausea onset yesterday , pain increases with deep inspiration and when bending , no emesis or diaphoresis . Pt. added black stool x1 today .

## 2016-09-16 ENCOUNTER — Other Ambulatory Visit: Payer: Self-pay

## 2016-09-16 ENCOUNTER — Emergency Department (HOSPITAL_COMMUNITY)
Admission: EM | Admit: 2016-09-16 | Discharge: 2016-09-16 | Disposition: A | Discharge status: Home / Self Care | Payer: 59 | Attending: Emergency Medicine | Admitting: Emergency Medicine

## 2016-09-16 ENCOUNTER — Emergency Department (HOSPITAL_COMMUNITY): Payer: 59

## 2016-09-16 DIAGNOSIS — R0789 Other chest pain: Secondary | ICD-10-CM

## 2016-09-16 DIAGNOSIS — R0600 Dyspnea, unspecified: Secondary | ICD-10-CM | POA: Diagnosis not present

## 2016-09-16 DIAGNOSIS — R072 Precordial pain: Secondary | ICD-10-CM

## 2016-09-16 MED ORDER — ONDANSETRON HCL 4 MG/2ML IJ SOLN
4.0000 mg | Freq: Once | INTRAMUSCULAR | Status: AC
  Administered 2016-09-16: 4 mg via INTRAVENOUS
  Filled 2016-09-16: qty 2

## 2016-09-16 MED ORDER — FAMOTIDINE IN NACL 20-0.9 MG/50ML-% IV SOLN
20.0000 mg | Freq: Once | INTRAVENOUS | Status: AC
  Administered 2016-09-16: 20 mg via INTRAVENOUS
  Filled 2016-09-16: qty 50

## 2016-09-16 MED ORDER — SODIUM CHLORIDE 0.9 % IV BOLUS (SEPSIS)
1000.0000 mL | Freq: Once | INTRAVENOUS | Status: AC
Start: 2016-09-16 — End: 2016-09-16
  Administered 2016-09-16: 1000 mL via INTRAVENOUS

## 2016-09-16 MED ORDER — IOPAMIDOL (ISOVUE-370) INJECTION 76%
INTRAVENOUS | Status: AC
  Administered 2016-09-16: 100 mL
  Filled 2016-09-16: qty 100

## 2016-09-16 MED ORDER — IBUPROFEN 400 MG PO TABS
600.0000 mg | ORAL_TABLET | Freq: Once | ORAL | Status: AC
  Administered 2016-09-16: 04:00:00 600 mg via ORAL
  Filled 2016-09-16: qty 1

## 2016-09-16 NOTE — Discharge Instructions (Addendum)

## 2016-09-16 NOTE — ED Provider Notes (Signed)
[facility]-EMERGENCY DEPT Provider Note   CSN: [id number] Arrival date & time: 09/15/16  2150     History   Chief Complaint Chief Complaint  Patient presents with  . Chest Pain  . Melena    HPI Brianna Russell is a 26 y.o. female history of bilateral pulmonary embolism no longer anticoagulated who presents with a three-day history of chest pain, back and neck stiffness, nausea, and one day history of black stool. Patient reports taking a medication over-the-counter for nausea that she cannot recall the name. Patient reports her chest pain as tightness and sharp. It is constant, but nonpleuritic. It is worsened with leaning forward. Patient has also had associated upper abdominal pain intermittently. Patient's episode of black stool was loose and diarrhea. Patient also took Tylenol in addition to over-the-counter anti-emetic. Patient has also had associated headache. She denies any sick contacts or recent travel. She denies any fevers, shortness of breath, urinary symptoms, abnormal vaginal bleeding or discharge.  HPI  Past Medical History:  Diagnosis Date  . Anxiety   . Bilateral pulmonary embolism (HCC) 09/03/2015  . Depression   . Gestational diabetes   . Hx of varicella   . Pleurisy     Patient Active Problem List   Diagnosis Date Noted  . Morbid (severe) obesity due to excess calories (HCC) 09/15/2015  . Pleural effusion on right sp PE likely onset 08/19/15 09/15/2015  . Bradycardia 09/04/2015  . Gestational diabetes 09/04/2015  . Bilateral pulmonary embolism (HCC) 09/03/2015  . Obesity, morbid (HCC) 09/03/2015  . Normocytic anemia 09/03/2015  . Pregnancy 08/05/2015    Past Surgical History:  Procedure Laterality Date  . CESAREAN SECTION    . CESAREAN SECTION N/A 08/05/2015   Procedure: REPEAT CESAREAN SECTION;  Surgeon: Richarda Overlie, MD;  Location: [facility] BIRTHING SUITES;  Service: Obstetrics;  Laterality: N/A;  [facility] T to RNFA - confirmed edc 08/23/15    . CHOLECYSTECTOMY       OB History    Gravida Para Term Preterm AB Living   2 2 1     2    SAB TAB Ectopic Multiple Live Births         0 2       Home Medications    Prior to Admission medications   Not on File    Family History Family History  Problem Relation Age of Onset  . Diabetes Father   . Stroke Father   . Hypertension Father   . Heart disease Father   . Cancer Paternal Aunt   . Cancer Paternal Grandmother   . Cancer Paternal Grandfather     Social History Social History  Substance Use Topics  . Smoking status: Never Smoker  . Smokeless tobacco: Never Used  . Alcohol use No     Allergies   Ibuprofen   Review of Systems Review of Systems  Constitutional: Negative for chills and fever.  HENT: Negative for facial swelling and sore throat.   Respiratory: Negative for shortness of breath.   Cardiovascular: Positive for chest pain.  Gastrointestinal: Positive for abdominal pain, blood in stool (black), diarrhea and nausea. Negative for vomiting.  Genitourinary: Negative for dysuria.  Musculoskeletal: Positive for back pain, neck pain and neck stiffness.  Skin: Negative for rash and wound.  Neurological: Positive for headaches.  Psychiatric/Behavioral: The patient is not nervous/anxious.      Physical Exam Updated Vital Signs BP 97/70   Pulse (!) 112   Temp 99.4 F (37.4 C) (Oral)  Resp 16   Ht 5\' 4"  (1.626 m)   Wt 117.9 kg (260 lb)   LMP 07/24/2016 (Approximate) Comment: Irregular  SpO2 99%   BMI 44.63 kg/m   Physical Exam  Constitutional: She appears well-developed and well-nourished. No distress.  HENT:  Head: Normocephalic and atraumatic.  Mouth/Throat: Oropharynx is clear and moist. No oropharyngeal exudate.  Eyes: Conjunctivae are normal. Pupils are equal, round, and reactive to light. Right eye exhibits no discharge. Left eye exhibits no discharge. No scleral icterus.  Neck: Normal range of motion. Neck supple. No thyromegaly present.  Patient able  to touch chin to chest without difficulty or pain  Cardiovascular: Normal rate, regular rhythm, normal heart sounds and intact distal pulses.  Exam reveals no gallop and no friction rub.   No murmur heard. Pulmonary/Chest: Effort normal and breath sounds normal. No stridor. No respiratory distress. She has no wheezes. She has no rales.  Abdominal: Soft. Bowel sounds are normal. She exhibits no distension. There is tenderness (Mild) in the suprapubic area. There is no rebound, no guarding, no CVA tenderness, no tenderness at McBurney's point and negative Murphy's sign.  Genitourinary: Rectal exam shows guaiac negative stool.  Musculoskeletal: She exhibits no edema.  No midline tenderness to the cervical, thoracic, lumbar spine  Lymphadenopathy:    She has no cervical adenopathy.  Neurological: She is alert. Coordination normal.  Skin: Skin is warm and dry. No rash noted. She is not diaphoretic. No pallor.  Psychiatric: She has a normal mood and affect.  Nursing note and vitals reviewed.    ED Treatments / Results  Labs (all labs ordered are listed, but only abnormal results are displayed) Labs Reviewed  BASIC METABOLIC PANEL - Abnormal; Notable for the following:       Result Value   Glucose, Bld 117 (*)    All other components within normal limits  CBC - Abnormal; Notable for the following:    Hemoglobin 15.1 (*)    All other components within normal limits  URINALYSIS, ROUTINE W REFLEX MICROSCOPIC  I-STAT TROPOININ, ED  POC OCCULT BLOOD, ED  I-STAT BETA HCG BLOOD, ED ([facility], [facility], [facility] ONLY)    EKG  EKG Interpretation  Date/Time:  Saturday September 15 2016 21:57:18 EDT Ventricular Rate:  117 PR Interval:  118 QRS Duration: 76 QT Interval:  304 QTC Calculation: 424 R Axis:   57 Text Interpretation:  Sinus tachycardia Otherwise normal ECG No acute changes No significant change since last tracing Confirmed by Derwood KaplanNanavati, Ankit [phone]([id number]) on 09/16/2016 12:59:39 AM       Radiology Dg  Chest 2 View  Result Date: 09/15/2016 CLINICAL DATA:  Nausea, vomiting and chest pain. History of pulmonary embolism, pleurisy. EXAM: CHEST  2 VIEW COMPARISON:  Chest radiograph September 29, 2015 FINDINGS: The cardiomediastinal silhouette is normal. Bibasilar strandy densities with mildly elevated RIGHT hemidiaphragm. No pleural effusion. No pneumothorax. Large body habitus. Surgical clips in the included right abdomen compatible with cholecystectomy. Osseous structures are nonsuspicious. IMPRESSION: Bibasilar atelectasis/scarring. Electronically Signed   By: Awilda Metroourtnay  Bloomer M.D.   On: 09/15/2016 22:22   Ct Angio Chest Pe W And/or Wo Contrast  Result Date: 09/16/2016 CLINICAL DATA:  Acute onset of dyspnea and nausea. Initial encounter. EXAM: CT ANGIOGRAPHY CHEST WITH CONTRAST TECHNIQUE: Multidetector CT imaging of the chest was performed using the standard protocol during bolus administration of intravenous contrast. Multiplanar CT image reconstructions and MIPs were obtained to evaluate the vascular anatomy. CONTRAST:  100 mL of Isovue 370 IV  contrast COMPARISON:  Chest radiograph performed 09/15/2016, and CTA of the chest performed 09/03/2015 FINDINGS: Cardiovascular:  There is no evidence of pulmonary embolus. The heart is borderline normal in size. The thoracic aorta is unremarkable in appearance. The great vessels are within normal limits. Mediastinum/Nodes: No mediastinal lymphadenopathy is seen. No pericardial effusion is identified. A small hypodensity at the left thyroid lobe is likely benign, given its size. No axillary lymphadenopathy is seen. Lungs/Pleura: Mild bibasilar atelectasis or scarring is noted, more prominent on the right. No pleural effusion or pneumothorax is seen. No masses are identified. Upper Abdomen: The visualized portions of the liver and spleen are grossly unremarkable. Musculoskeletal: No acute osseous abnormalities are identified. The visualized musculature is unremarkable in  appearance. Review of the MIP images confirms the above findings. IMPRESSION: 1. No evidence of pulmonary embolus. 2. Mild bibasilar atelectasis or scarring, more prominent on the right. Electronically Signed   By: Roanna Raider M.D.   On: 09/16/2016 02:54    Procedures Procedures (including critical care time)  Medications Ordered in ED Medications  sodium chloride 0.9 % bolus 1,000 mL (0 mLs Intravenous Stopped 09/16/16 0349)  famotidine (PEPCID) IVPB 20 mg premix (0 mg Intravenous Stopped 09/16/16 0348)  ondansetron (ZOFRAN) injection 4 mg (4 mg Intravenous Given 09/16/16 0121)  iopamidol (ISOVUE-370) 76 % injection (100 mLs  Contrast Given 09/16/16 0234)  ibuprofen (ADVIL,MOTRIN) tablet 600 mg (600 mg Oral Given 09/16/16 0353)     Initial Impression / Assessment and Plan / ED Course  I have reviewed the triage vital signs and the nursing notes.  Pertinent labs & imaging results that were available during my care of the patient were reviewed by me and considered in my medical decision making (see chart for details).  Clinical Course as of Sep 17 634  Wynelle Link Sep 16, 2016  1610 Results from the ER workup discussed with the patient face to face and all questions answered to the best of my ability.  Pt still has chest discomfort. Chest pain started since morning. Trop is neg. EKG has no ischemic findings. Pt doesn't smoke, use drugs. She has no premature CAd in the family. There is no hx of autoimmune condition in the family. Pt has a PCP and is willing to see them soon.   Strict ER return precautions have been discussed, and patient is agreeing with the plan and is comfortable with the workup done and the recommendations from the ER.  CT Angio Chest PE W and/or Wo Contrast [AN]    Clinical Course User Index [AN] Derwood Kaplan, MD    Wells score 3. CT angios was done and showed no PE. CBC, BMP, troponin within normal limits. CXR shows bibasilar atelectasis or scarring. EKG showed sinus  tachycardia, but no significant change since last tracing. No risk factors or family history of CAD. Suspect viral syndrome as cause for her GI symptoms. Patient to follow up with PCP for further workup. Strict return precautions discussed by Dr. Rhunette Croft. Patient discharged in satisfactory condition. Patient also evaluated by Rhunette Croft who guided the patient's management, seemed care, and agrees with plan.  Final Clinical Impressions(s) / ED Diagnoses   Final diagnoses:  Atypical chest pain  Precordial chest pain    New Prescriptions There are no discharge medications for this patient.    Emi Holes, PA-C 09/16/16 [id number]    Derwood Kaplan, MD 09/16/16 [phone]

## 2016-09-17 DIAGNOSIS — R0789 Other chest pain: Secondary | ICD-10-CM | POA: Diagnosis not present

## 2016-09-17 DIAGNOSIS — R05 Cough: Secondary | ICD-10-CM | POA: Diagnosis not present

## 2016-09-17 DIAGNOSIS — R0602 Shortness of breath: Secondary | ICD-10-CM | POA: Diagnosis not present

## 2016-09-17 DIAGNOSIS — R918 Other nonspecific abnormal finding of lung field: Secondary | ICD-10-CM | POA: Diagnosis not present

## 2016-09-17 LAB — POC OCCULT BLOOD, ED: FECAL OCCULT BLD: NEGATIVE

## 2016-09-20 DIAGNOSIS — Z86711 Personal history of pulmonary embolism: Secondary | ICD-10-CM | POA: Diagnosis not present

## 2016-09-20 DIAGNOSIS — J90 Pleural effusion, not elsewhere classified: Secondary | ICD-10-CM | POA: Diagnosis not present

## 2016-09-20 DIAGNOSIS — R0781 Pleurodynia: Secondary | ICD-10-CM | POA: Diagnosis not present

## 2017-01-22 DIAGNOSIS — J90 Pleural effusion, not elsewhere classified: Secondary | ICD-10-CM | POA: Diagnosis not present

## 2017-01-22 DIAGNOSIS — R5383 Other fatigue: Secondary | ICD-10-CM | POA: Diagnosis not present

## 2017-01-22 DIAGNOSIS — R768 Other specified abnormal immunological findings in serum: Secondary | ICD-10-CM | POA: Diagnosis not present

## 2017-03-28 DIAGNOSIS — R1013 Epigastric pain: Secondary | ICD-10-CM | POA: Diagnosis not present

## 2017-04-16 DIAGNOSIS — N926 Irregular menstruation, unspecified: Secondary | ICD-10-CM | POA: Diagnosis not present

## 2017-04-30 DIAGNOSIS — R5383 Other fatigue: Secondary | ICD-10-CM | POA: Diagnosis not present

## 2017-04-30 DIAGNOSIS — R768 Other specified abnormal immunological findings in serum: Secondary | ICD-10-CM | POA: Diagnosis not present

## 2017-05-22 DIAGNOSIS — N912 Amenorrhea, unspecified: Secondary | ICD-10-CM | POA: Diagnosis not present

## 2017-05-22 DIAGNOSIS — Z86711 Personal history of pulmonary embolism: Secondary | ICD-10-CM | POA: Diagnosis not present

## 2017-05-22 DIAGNOSIS — D849 Immunodeficiency, unspecified: Secondary | ICD-10-CM | POA: Diagnosis not present

## 2017-05-25 DIAGNOSIS — R109 Unspecified abdominal pain: Secondary | ICD-10-CM | POA: Diagnosis not present

## 2017-05-25 DIAGNOSIS — K2980 Duodenitis without bleeding: Secondary | ICD-10-CM | POA: Diagnosis not present

## 2017-05-25 DIAGNOSIS — Z9049 Acquired absence of other specified parts of digestive tract: Secondary | ICD-10-CM | POA: Diagnosis not present

## 2017-06-06 DIAGNOSIS — R1013 Epigastric pain: Secondary | ICD-10-CM | POA: Diagnosis not present

## 2017-06-06 DIAGNOSIS — K219 Gastro-esophageal reflux disease without esophagitis: Secondary | ICD-10-CM | POA: Diagnosis not present

## 2017-08-06 DIAGNOSIS — Z7984 Long term (current) use of oral hypoglycemic drugs: Secondary | ICD-10-CM | POA: Diagnosis not present

## 2017-08-06 DIAGNOSIS — S199XXA Unspecified injury of neck, initial encounter: Secondary | ICD-10-CM | POA: Diagnosis not present

## 2017-08-06 DIAGNOSIS — S161XXA Strain of muscle, fascia and tendon at neck level, initial encounter: Secondary | ICD-10-CM | POA: Diagnosis not present

## 2017-08-06 DIAGNOSIS — T148XXA Other injury of unspecified body region, initial encounter: Secondary | ICD-10-CM | POA: Diagnosis not present

## 2017-08-06 DIAGNOSIS — M542 Cervicalgia: Secondary | ICD-10-CM | POA: Diagnosis not present

## 2017-08-28 DIAGNOSIS — J029 Acute pharyngitis, unspecified: Secondary | ICD-10-CM | POA: Diagnosis not present

## 2017-08-28 DIAGNOSIS — R197 Diarrhea, unspecified: Secondary | ICD-10-CM | POA: Diagnosis not present

## 2017-11-19 DIAGNOSIS — J0101 Acute recurrent maxillary sinusitis: Secondary | ICD-10-CM | POA: Diagnosis not present

## 2017-11-19 DIAGNOSIS — Z681 Body mass index (BMI) 19 or less, adult: Secondary | ICD-10-CM | POA: Diagnosis not present

## 2017-11-19 DIAGNOSIS — R300 Dysuria: Secondary | ICD-10-CM | POA: Diagnosis not present

## 2017-11-20 DIAGNOSIS — B029 Zoster without complications: Secondary | ICD-10-CM | POA: Diagnosis not present

## 2018-02-20 IMAGING — DX DG CHEST 2V
2 series · 2 of 2 positions shown · non-contrast
Comparison: None.

CLINICAL DATA: Acute onset of right-sided chest pain. Initial
encounter.

EXAM:
CHEST  2 VIEW

[chest pa]
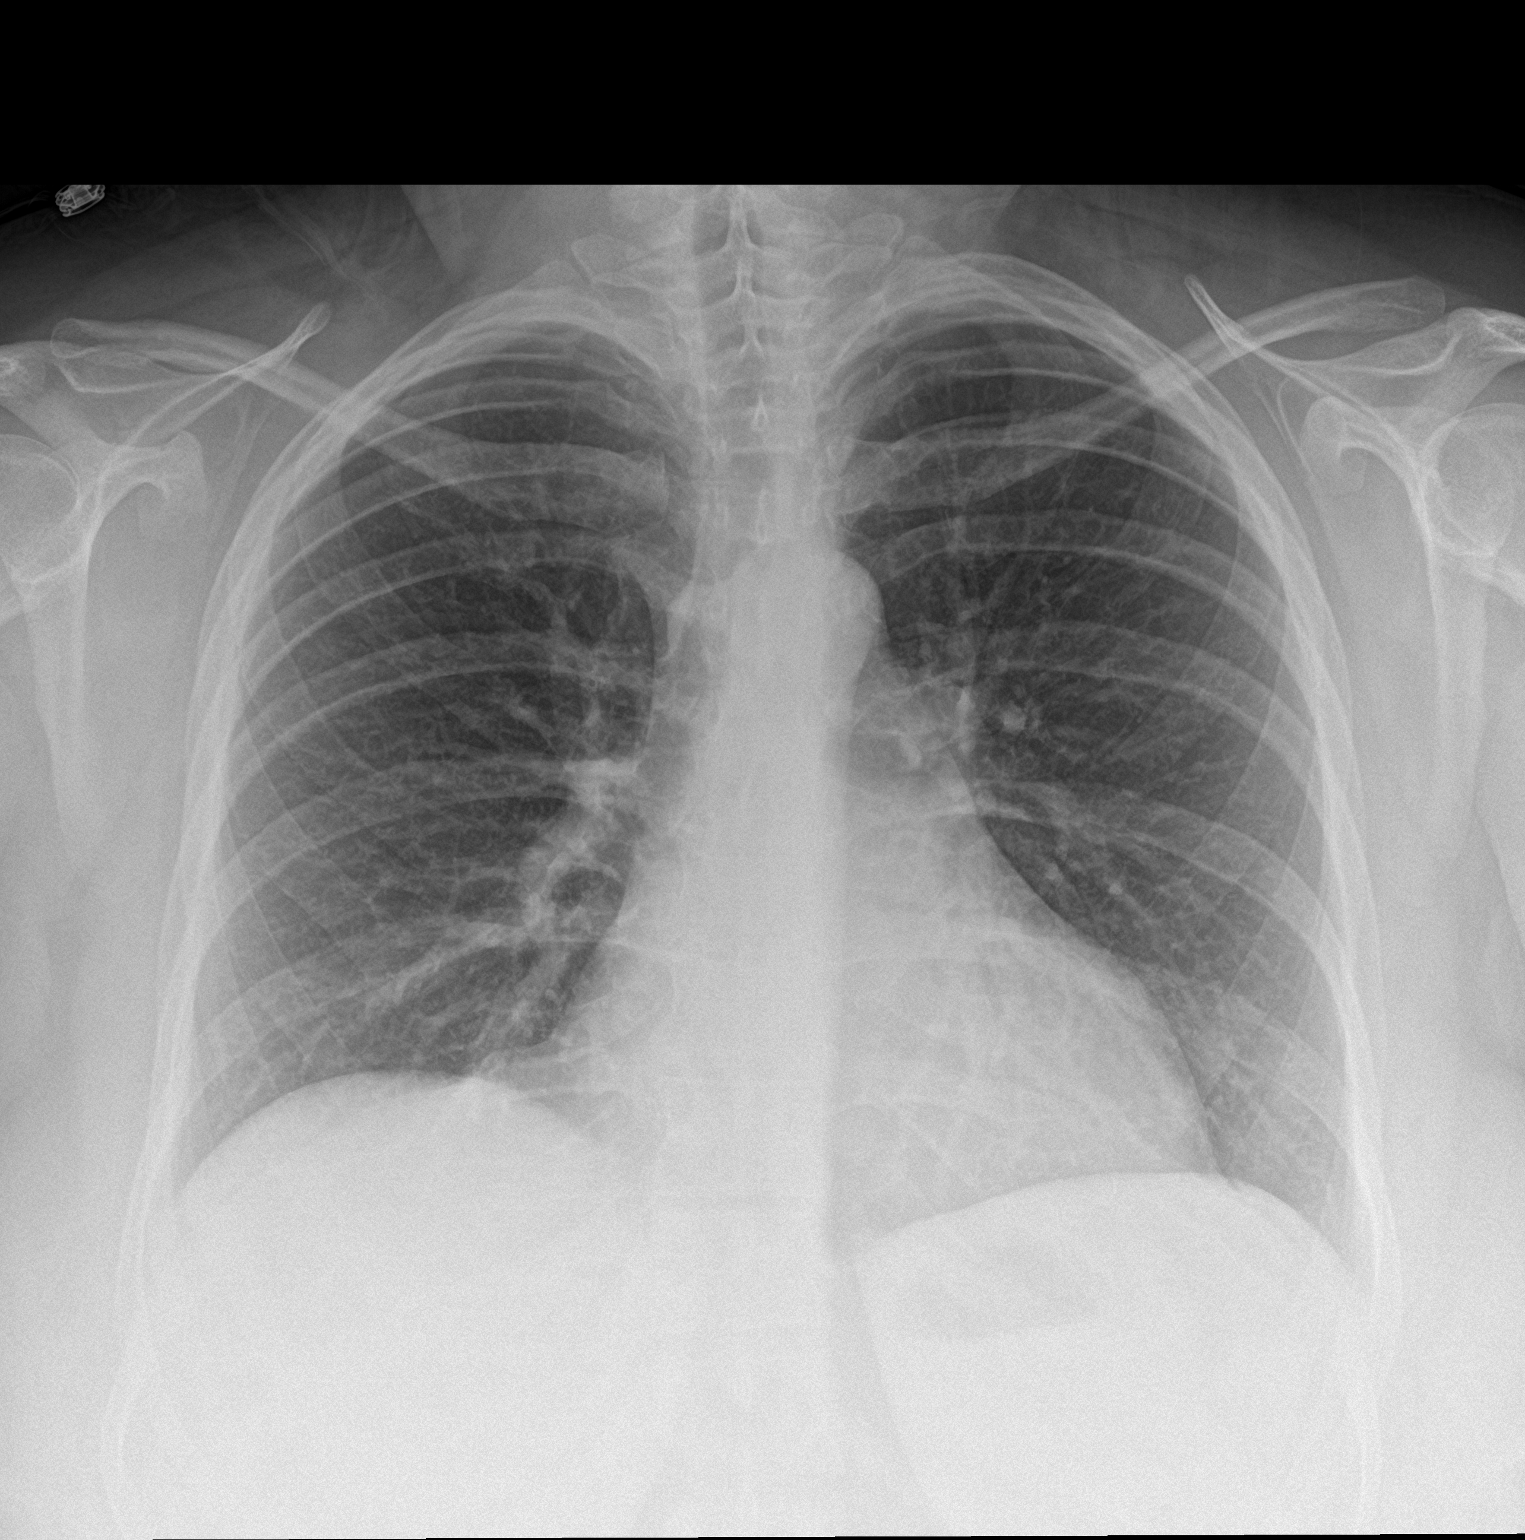

[chest lat]
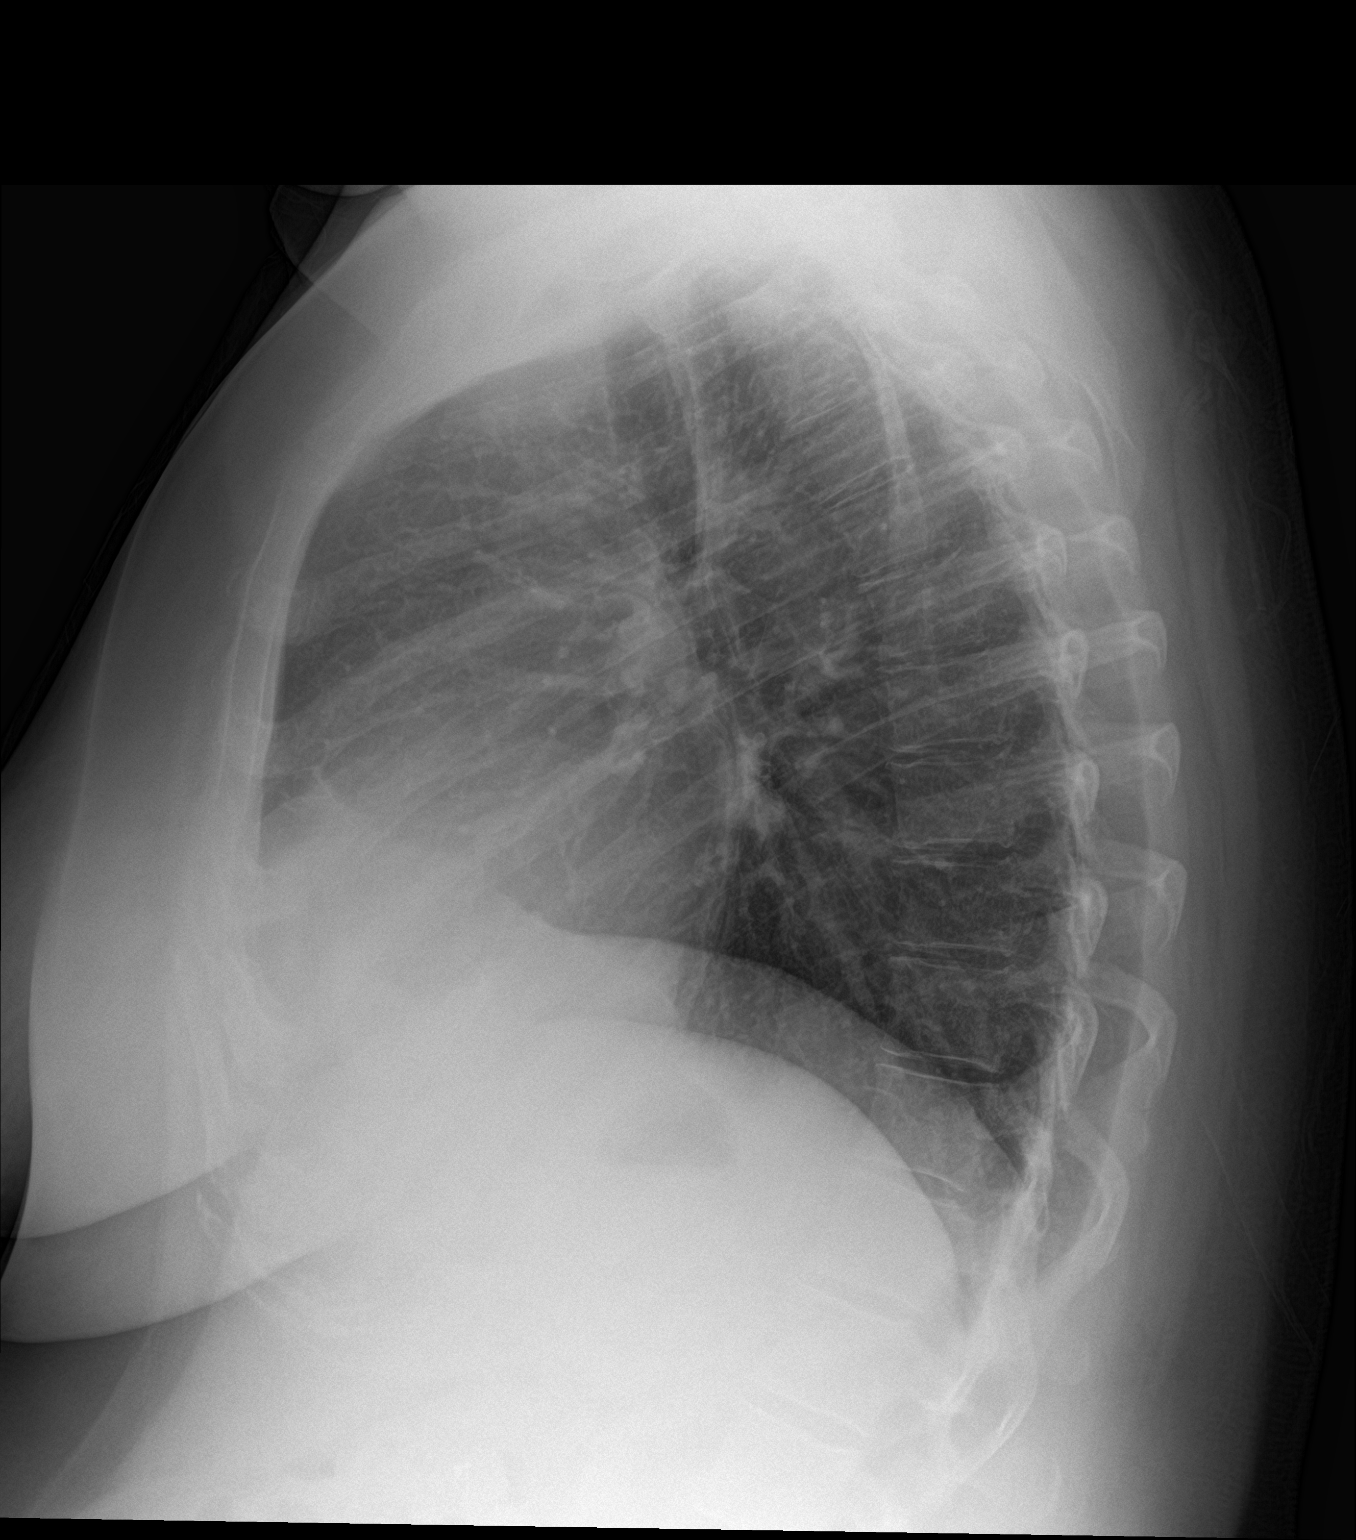

[2 of 2 positions shown; findings below may reference images not displayed]

FINDINGS: The lungs are well-aerated. Mild peribronchial thickening is noted.
There is no evidence of focal opacification, pleural effusion or
pneumothorax.

The heart is normal in size; the mediastinal contour is within
normal limits. No acute osseous abnormalities are seen.
IMPRESSION: Mild peribronchial thickening noted.  Lungs otherwise clear.

## 2018-04-03 DIAGNOSIS — N939 Abnormal uterine and vaginal bleeding, unspecified: Secondary | ICD-10-CM | POA: Diagnosis not present

## 2018-04-03 DIAGNOSIS — N760 Acute vaginitis: Secondary | ICD-10-CM | POA: Diagnosis not present

## 2018-04-03 DIAGNOSIS — E669 Obesity, unspecified: Secondary | ICD-10-CM | POA: Diagnosis not present

## 2018-04-28 DIAGNOSIS — D509 Iron deficiency anemia, unspecified: Secondary | ICD-10-CM | POA: Diagnosis not present

## 2018-04-28 DIAGNOSIS — N921 Excessive and frequent menstruation with irregular cycle: Secondary | ICD-10-CM | POA: Diagnosis not present

## 2018-04-29 ENCOUNTER — Other Ambulatory Visit: Payer: Self-pay

## 2018-04-29 ENCOUNTER — Emergency Department (HOSPITAL_COMMUNITY)
Admission: EM | Admit: 2018-04-29 | Discharge: 2018-04-29 | Disposition: A | Discharge status: Home / Self Care | Payer: 59 | Attending: Emergency Medicine | Admitting: Emergency Medicine

## 2018-04-29 DIAGNOSIS — N938 Other specified abnormal uterine and vaginal bleeding: Secondary | ICD-10-CM | POA: Diagnosis not present

## 2018-04-29 LAB — CBC
HCT: 39.9 % (ref 36.0–46.0)
Hemoglobin: 12.9 g/dL (ref 12.0–15.0)
MCH: 30.1 pg (ref 26.0–34.0)
MCHC: 32.3 g/dL (ref 30.0–36.0)
MCV: 93.2 fL (ref 80.0–100.0)
Platelets: 261 10*3/uL (ref 150–400)
RBC: 4.28 MIL/uL (ref 3.87–5.11)
RDW: 12.2 % (ref 11.5–15.5)
WBC: 6.3 10*3/uL (ref 4.0–10.5)
nRBC: 0 % (ref 0.0–0.2)

## 2018-04-29 LAB — COMPREHENSIVE METABOLIC PANEL
ALK PHOS: 50 U/L (ref 38–126)
ALT: 23 U/L (ref 0–44)
AST: 16 U/L (ref 15–41)
Albumin: 3.4 g/dL — ABNORMAL LOW (ref 3.5–5.0)
Anion gap: 8 (ref 5–15)
BILIRUBIN TOTAL: 0.5 mg/dL (ref 0.3–1.2)
BUN: 8 mg/dL (ref 6–20)
CALCIUM: 9.2 mg/dL (ref 8.9–10.3)
CO2: 23 mmol/L (ref 22–32)
Chloride: 104 mmol/L (ref 98–111)
Creatinine, Ser: 0.69 mg/dL (ref 0.44–1.00)
GFR calc Af Amer: >60 mL/min (ref >60)
Glucose, Bld: 121 mg/dL — ABNORMAL HIGH (ref 70–99)
POTASSIUM: 3.9 mmol/L (ref 3.5–5.1)
Sodium: 135 mmol/L (ref 135–145)
TOTAL PROTEIN: 7.4 g/dL (ref 6.5–8.1)

## 2018-04-29 LAB — I-STAT BETA HCG BLOOD, ED (MC, WL, AP ONLY): I-stat hCG, quantitative: <5 m[IU]/mL (ref <5)

## 2018-04-29 LAB — I-STAT TROPONIN, ED: TROPONIN I, POC: 0 ng/mL (ref 0.00–0.08)

## 2018-04-29 LAB — WET PREP, GENITAL
Clue Cells Wet Prep HPF POC: NONE SEEN
SPERM: NONE SEEN
Trich, Wet Prep: NONE SEEN
YEAST WET PREP: NONE SEEN

## 2018-04-29 LAB — TYPE AND SCREEN
ABO/RH(D): A POS
ANTIBODY SCREEN: NEGATIVE

## 2018-04-29 LAB — ABO/RH: ABO/RH(D): A POS

## 2018-04-29 MED ORDER — KETOROLAC TROMETHAMINE 60 MG/2ML IM SOLN
15.0000 mg | Freq: Once | INTRAMUSCULAR | Status: AC
  Administered 2018-04-29: 15 mg via INTRAMUSCULAR
  Filled 2018-04-29: qty 2

## 2018-04-29 NOTE — Discharge Instructions (Signed)
Follow up with your gyn.  Typically this pain is much better with nsaids like ibuprofen or naproxyn.    Take 4 over the counter ibuprofen tablets 3 times a day or 2 over-the-counter naproxen tablets twice a day for pain. Also take tylenol 1000mg (2 extra strength) four times a day.

## 2018-04-29 NOTE — ED Provider Notes (Signed)
[facility] EMERGENCY [facility]PARTMENT Provider Note   CSN: [id number] Arrival date & time: 04/29/18  0230    History   Chief Complaint Chief Complaint  Patient presents with  . Vaginal Bleeding    HPI Brianna Russell is a 28 y.o. female.     28 yo F with a chief complaint of vaginal bleeding.  Going on for the past couple months but worsening over the past couple days.  The patient was recently seen by her OB/GYN and started on birth control about 2 weeks ago.  She is on Tri-Lo-Marzia.  She denies missing any doses.  With the worsening bleeding she is starting to have some pelvic cramping.  Denies any discharge.  Denies dysuria increased frequency or hesitancy.  Saw her OB/GYN yesterday who she feels like did not do anything for her and sent her home so she came to the ED today.  She is feeling a little bit lightheaded.  The history is provided by the patient.  Vaginal Bleeding  Quality:  Bright red and clots Severity:  Moderate Onset quality:  Sudden Duration:  2 months Timing:  Constant Progression:  Worsening Chronicity:  New Menstrual history:  Irregular Possible pregnancy: no   Context: at rest   Relieved by:  Nothing Worsened by:  Nothing Ineffective treatments:  None tried Associated symptoms: no dizziness, no dysuria, no fever and no nausea     Past Medical History:  Diagnosis Date  . Anxiety   . Bilateral pulmonary embolism (HCC) 09/03/2015  . Depression   . Gestational diabetes   . Hx of varicella   . Pleurisy     Patient Active Problem List   Diagnosis Date Noted  . Morbid (severe) obesity due to excess calories (HCC) 09/15/2015  . Pleural effusion on right sp PE likely onset 08/19/15 09/15/2015  . Bradycardia 09/04/2015  . Gestational diabetes 09/04/2015  . Bilateral pulmonary embolism (HCC) 09/03/2015  . Obesity, morbid (HCC) 09/03/2015  . Normocytic anemia 09/03/2015  . Pregnancy 08/05/2015    Past Surgical History:  Procedure  Laterality Date  . CESAREAN SECTION    . CESAREAN SECTION N/A 08/05/2015   Procedure: REPEAT CESAREAN SECTION;  Surgeon: Richarda Overlieichard Holland, MD;  [facility];  Service: Obstetrics;  Laterality: N/A;  Kennith Centerracey T to RNFA - confirmed edc 08/23/15    . CHOLECYSTECTOMY       OB History    Gravida  2   Para  2   Term  1   Preterm      AB      Living  2     SAB      TAB      Ectopic      Multiple  0   Live Births  2            Home Medications    Prior to Admission medications   Not on File    Family History Family History  Problem Relation Age of Onset  . Diabetes Father   . Stroke Father   . Hypertension Father   . Heart disease Father   . Cancer Paternal Aunt   . Cancer Paternal Grandmother   . Cancer Paternal Grandfather     Social History Social History   Tobacco Use  . Smoking status: Never Smoker  . Smokeless tobacco: Never Used  Substance Use Topics  . Alcohol use: No    Alcohol/week: 0.0 standard drinks  . Drug use: No  Allergies   Ibuprofen   Review of Systems Review of Systems  Constitutional: Negative for chills and fever.  HENT: Negative for congestion and rhinorrhea.   Eyes: Negative for redness and visual disturbance.  Respiratory: Negative for shortness of breath and wheezing.   Cardiovascular: Negative for chest pain and palpitations.  Gastrointestinal: Negative for nausea and vomiting.  Genitourinary: Positive for vaginal bleeding. Negative for dysuria and urgency.  Musculoskeletal: Negative for arthralgias and myalgias.  Skin: Negative for pallor and wound.  Neurological: Negative for dizziness and headaches.     Physical Exam Updated Vital Signs BP (!) 153/102 (BP Location: Right Arm)   Pulse (!) 103   Temp 99 F (37.2 C) (Oral)   Resp 18   Ht 5\' 5"  (1.651 m)   Wt 123.4 kg   SpO2 98%   BMI 45.26 kg/m   Physical Exam Vitals signs and nursing note reviewed.  Constitutional:      General:  She is not in acute distress.    Appearance: She is well-developed. She is not diaphoretic.  HENT:     Head: Normocephalic and atraumatic.  Eyes:     Pupils: Pupils are equal, round, and reactive to light.  Neck:     Musculoskeletal: Normal range of motion and neck supple.  Cardiovascular:     Rate and Rhythm: Normal rate and regular rhythm.     Heart sounds: No murmur. No friction rub. No gallop.   Pulmonary:     Effort: Pulmonary effort is normal.     Breath sounds: No wheezing or rales.  Abdominal:     General: There is no distension.     Palpations: Abdomen is soft.     Tenderness: There is no abdominal tenderness.  Genitourinary:    Comments: Blood in the vault, trace amount of blood at the os, no active bleeding from the os.  No uterine tenderness or adnexal tenderness Musculoskeletal:        General: No tenderness.  Skin:    General: Skin is warm and dry.  Neurological:     Mental Status: She is alert and oriented to person, place, and time.  Psychiatric:        Behavior: Behavior normal.      ED Treatments / Results  Labs (all labs ordered are listed, but only abnormal results are displayed) Labs Reviewed  WET PREP, GENITAL - Abnormal; Notable for the following components:      Result Value   WBC, Wet Prep HPF POC MODERATE (*)    All other components within normal limits  COMPREHENSIVE METABOLIC PANEL - Abnormal; Notable for the following components:   Glucose, Bld 121 (*)    Albumin 3.4 (*)    All other components within normal limits  CBC  I-STAT BETA HCG BLOOD, ED ([facility], [facility], [facility] ONLY)  I-STAT TROPONIN, ED  I-STAT BETA HCG BLOOD, ED ([facility], [facility], [facility] ONLY)  TYPE AND SCREEN  ABO/RH  GC/CHLAMYDIA PROBE AMP ([facility]) NOT AT [facility]    EKG None  Radiology No results found.  Procedures Procedures (including critical care time)  Medications Ordered in ED Medications  ketorolac (TORADOL) injection 15 mg (has no administration in time range)     Initial  Impression / Assessment and Plan / ED Course  I have reviewed the triage vital signs and the nursing notes.  Pertinent labs & imaging results that were available during my care of the patient were reviewed by me and considered in my medical decision  making (see chart for details).        28 yo F with a chief complaint of vaginal bleeding.  The patient is not pregnant, pelvic exam with no significant bleeding from the cervix.  I discussed the case with an OB/GYN from her group Dr. Rana Snare, he did not recommend any acute medication changes and suggested that she follow-up in the clinic for repeat evaluation.  10:03 AM:  I have discussed the diagnosis/risks/treatment options with the patient and family and believe the pt to be eligible for discharge home to follow-up with Gyn. We also discussed returning to the ED immediately if new or worsening sx occur. We discussed the sx which are most concerning (e.g., sudden worsening bleeding, sob, syncope) that necessitate immediate return. Medications administered to the patient during their visit and any new prescriptions provided to the patient are listed below.  Medications given during this visit Medications  ketorolac (TORADOL) injection 15 mg (has no administration in time range)     The patient appears reasonably screen and/or stabilized for discharge and I doubt any other medical condition or other [facility] requiring further screening, evaluation, or treatment in the ED at this time prior to discharge.    Final Clinical Impressions(s) / ED Diagnoses   Final diagnoses:  DUB (dysfunctional uterine bleeding)    ED Discharge Orders    None       Melene Plan, DO 04/29/18 1003

## 2018-04-29 NOTE — ED Triage Notes (Signed)
Per pt she has been having vaginal bleeding for about 2 months and the past two days has been very heavy and several massive clots. Severe pelvic pain since today. Feeling more tired than normal.

## 2018-04-30 LAB — GC/CHLAMYDIA PROBE AMP (~~LOC~~) NOT AT ARMC
Chlamydia: NEGATIVE
Neisseria Gonorrhea: NEGATIVE

## 2018-10-07 ENCOUNTER — Ambulatory Visit: Payer: 59 | Admitting: General Surgery

## 2018-10-07 ENCOUNTER — Encounter (INDEPENDENT_AMBULATORY_CARE_PROVIDER_SITE_OTHER): Payer: Self-pay

## 2018-10-07 ENCOUNTER — Encounter: Payer: Self-pay | Admitting: General Surgery

## 2018-10-07 ENCOUNTER — Other Ambulatory Visit: Payer: Self-pay

## 2018-10-07 VITALS — BP 115/76 | HR 93 | Temp 98.7°F | Resp 16 | Ht 65.0 in | Wt 276.0 lb

## 2018-10-07 DIAGNOSIS — K432 Incisional hernia without obstruction or gangrene: Secondary | ICD-10-CM

## 2018-10-07 NOTE — H&P (Signed)
Brianna HampshireSarah D Russell; [id number]; Sep 03, 1990   HPI Patient is a 28 year old white female who was referred to my care by Wayland DenisKatherine Skillman for evaluation treatment of an umbilical hernia.  Patient states that she developed the hernia recently and is made worse with straining.  It does cause her some pain when she is straining.  She currently has 0 out of 10 abdominal pain.  She is status post laparoscopic cholecystectomy in the remote past.  Her C-section was complicated by a DVT which was treated for 6 months with Eliquis. Past Medical History:  Diagnosis Date  . Anxiety   . Bilateral pulmonary embolism (HCC) 09/03/2015  . Depression   . Gestational diabetes   . Hx of varicella   . Pleurisy     Past Surgical History:  Procedure Laterality Date  . CESAREAN SECTION    . CESAREAN SECTION N/A 08/05/2015   Procedure: REPEAT CESAREAN SECTION;  Surgeon: Richarda Overlieichard Holland, MD;  [facility];  Service: Obstetrics;  Laterality: N/A;  Kennith Centerracey T to RNFA - confirmed edc 08/23/15    . CHOLECYSTECTOMY      Family History  Problem Relation Age of Onset  . Diabetes Father   . Stroke Father   . Hypertension Father   . Heart disease Father   . Cancer Paternal Aunt   . Cancer Paternal Grandmother   . Cancer Paternal Grandfather     Current Outpatient Medications on File Prior to Visit  Medication Sig Dispense Refill  . TRI-LO-MARZIA 0.18/0.215/0.25 MG-25 MCG tab Take 1 tablet by mouth daily.     No current facility-administered medications on file prior to visit.     Allergies  Allergen Reactions  . Ibuprofen Nausea Only    Headache     Social History   Substance and Sexual Activity  Alcohol Use No  . Alcohol/week: 0.0 standard drinks    Social History   Tobacco Use  Smoking Status Never Smoker  Smokeless Tobacco Never Used    Review of Systems  Constitutional: Positive for malaise/fatigue.  HENT: Negative.   Eyes: Negative.   Respiratory: Negative.    Cardiovascular: Negative.   Gastrointestinal: Positive for abdominal pain.  Genitourinary: Negative.   Musculoskeletal: Negative.   Skin: Negative.   Neurological: Negative.   Psychiatric/Behavioral: Negative.     Objective   Vitals:   10/07/18 1405  BP: 115/76  Pulse: 93  Resp: 16  Temp: 98.7 F (37.1 C)  SpO2: 97%    Physical Exam Vitals signs reviewed.  Constitutional:      Appearance: Normal appearance. She is obese. She is not ill-appearing.  HENT:     Head: Normocephalic and atraumatic.  Cardiovascular:     Rate and Rhythm: Normal rate and regular rhythm.     Heart sounds: Normal heart sounds. No murmur. No friction rub. No gallop.   Pulmonary:     Effort: Pulmonary effort is normal. No respiratory distress.     Breath sounds: Normal breath sounds. No stridor. No wheezing, rhonchi or rales.  Abdominal:     General: Bowel sounds are normal. There is no distension.     Palpations: Abdomen is soft. There is no mass.     Tenderness: There is no abdominal tenderness. There is no guarding or rebound.     Hernia: A hernia is present.     Comments: Reducible umbilical hernia below a superior umbilical incision scar.  Skin:    General: Skin is warm and dry.  Neurological:  Mental Status: She is alert and oriented to person, place, and time.     Assessment  Incisional hernia Plan   Patient is scheduled for incisional herniorrhaphy with mesh on 10/20/2018.  The risks and benefits of the procedure including bleeding, infection, mesh use, the possibility of recurrence of the hernia were fully explained to the patient, gave informed consent.  She will receive subcutaneous Lovenox preoperatively at the time of surgery.

## 2018-10-07 NOTE — Patient Instructions (Signed)
Ventral Hernia  A ventral hernia is a bulge of tissue from inside the abdomen that pushes through a weak area of the muscles that form the front wall of the abdomen. The tissues inside the abdomen are inside a sac (peritoneum). These tissues include the small intestine, large intestine, and the fatty tissue that covers the intestines (omentum). Sometimes, the bulge that forms a hernia contains intestines. Other hernias contain only fat. Ventral hernias do not go away without surgical treatment. There are several types of ventral hernias. You may have:  A hernia at an incision site from previous abdominal surgery (incisional hernia).  A hernia just above the belly button (epigastric hernia), or at the belly button (umbilical hernia). These types of hernias can develop from heavy lifting or straining.  A hernia that comes and goes (reducible hernia). It may be visible only when you lift or strain. This type of hernia can be pushed back into the abdomen (reduced).  A hernia that traps abdominal tissue inside the hernia (incarcerated hernia). This type of hernia does not reduce.  A hernia that cuts off blood flow to the tissues inside the hernia (strangulated hernia). The tissues can start to die if this happens. This is a very painful bulge that cannot be reduced. A strangulated hernia is a medical emergency. What are the causes? This condition is caused by abdominal tissue putting pressure on an area of weakness in the abdominal muscles. What increases the risk? The following factors may make you more likely to develop this condition:  Being female.  Being 60 or older.  Being overweight or obese.  Having had previous abdominal surgery, especially if there was an infection after surgery.  Having had an injury to the abdominal wall.  Having had several pregnancies.  Having a buildup of fluid inside the abdomen (ascites). What are the signs or symptoms? The only symptom of a ventral hernia  may be a painless bulge in the abdomen. A reducible hernia may be visible only when you strain, cough, or lift. Other symptoms may include:  Dull pain.  A feeling of pressure. Signs and symptoms of a strangulated hernia may include:  Increasing pain.  Nausea and vomiting.  Pain when pressing on the hernia.  The skin over the hernia turning red or purple.  Constipation.  Blood in the stool (feces). How is this diagnosed? This condition may be diagnosed based on:  Your symptoms.  Your medical history.  A physical exam. You may be asked to cough or strain while standing. These actions increase the pressure inside your abdomen and force the hernia through the opening in your muscles. Your health care provider may try to reduce the hernia by pressing on it.  Imaging studies, such as an ultrasound or CT scan. How is this treated? This condition is treated with surgery. If you have a strangulated hernia, surgery is done as soon as possible. If your hernia is small and not incarcerated, you may be asked to lose some weight before surgery. Follow these instructions at home:  Follow instructions from your health care provider about eating or drinking restrictions.  If you are overweight, your health care provider may recommend that you increase your activity level and eat a healthier diet.  Do not lift anything that is heavier than 10 lb (4.5 kg).  Return to your normal activities as told by your health care provider. Ask your health care provider what activities are safe for you. You may need to avoid activities   that increase pressure on your hernia.  Take over-the-counter and prescription medicines only as told by your health care provider.  Keep all follow-up visits as told by your health care provider. This is important. Contact a health care provider if:  Your hernia gets larger.  Your hernia becomes painful. Get help right away if:  Your hernia becomes increasingly  painful.  You have pain along with any of the following: ? Changes in skin color in the area of the hernia. ? Nausea. ? Vomiting. ? Fever. Summary  A ventral hernia is a bulge of tissue from inside the abdomen that pushes through a weak area of the muscles that form the front wall of the abdomen.  This condition is treated with surgery, which may be urgent depending on your hernia.  Do not lift anything that is heavier than 10 lb (4.5 kg), and follow activity instructions from your health care provider. This information is not intended to replace advice given to you by your health care provider. Make sure you discuss any questions you have with your health care provider. Document Released: 02/13/2012 Document Revised: 04/10/2017 Document Reviewed: 09/17/2016 Elsevier Patient Education  2020 [employer] Inc[employer]  

## 2018-10-07 NOTE — Progress Notes (Signed)
Brianna Russell; [id number]; 04/04/1990   HPI Patient is a 28-year-old white female who was referred to my care by Katherine Skillman for evaluation treatment of an umbilical hernia.  Patient states that she developed the hernia recently and is made worse with straining.  It does cause her some pain when she is straining.  She currently has 0 out of 10 abdominal pain.  She is status post laparoscopic cholecystectomy in the remote past.  Her C-section was complicated by a DVT which was treated for 6 months with Eliquis. Past Medical History:  Diagnosis Date  . Anxiety   . Bilateral pulmonary embolism (HCC) 09/03/2015  . Depression   . Gestational diabetes   . Hx of varicella   . Pleurisy     Past Surgical History:  Procedure Laterality Date  . CESAREAN SECTION    . CESAREAN SECTION N/A 08/05/2015   Procedure: REPEAT CESAREAN SECTION;  Surgeon: Richard Holland, MD;  [facility]: [facility];  Service: Obstetrics;  Laterality: N/A;  Tracey T to RNFA - confirmed edc 08/23/15    . CHOLECYSTECTOMY      Family History  Problem Relation Age of Onset  . Diabetes Father   . Stroke Father   . Hypertension Father   . Heart disease Father   . Cancer Paternal Aunt   . Cancer Paternal Grandmother   . Cancer Paternal Grandfather     Current Outpatient Medications on File Prior to Visit  Medication Sig Dispense Refill  . TRI-LO-MARZIA 0.18/0.215/0.25 MG-25 MCG tab Take 1 tablet by mouth daily.     No current facility-administered medications on file prior to visit.     Allergies  Allergen Reactions  . Ibuprofen Nausea Only    Headache     Social History   Substance and Sexual Activity  Alcohol Use No  . Alcohol/week: 0.0 standard drinks    Social History   Tobacco Use  Smoking Status Never Smoker  Smokeless Tobacco Never Used    Review of Systems  Constitutional: Positive for malaise/fatigue.  HENT: Negative.   Eyes: Negative.   Respiratory: Negative.    Cardiovascular: Negative.   Gastrointestinal: Positive for abdominal pain.  Genitourinary: Negative.   Musculoskeletal: Negative.   Skin: Negative.   Neurological: Negative.   Psychiatric/Behavioral: Negative.     Objective   Vitals:   10/07/18 1405  BP: 115/76  Pulse: 93  Resp: 16  Temp: 98.7 F (37.1 C)  SpO2: 97%    Physical Exam Vitals signs reviewed.  Constitutional:      Appearance: Normal appearance. She is obese. She is not ill-appearing.  HENT:     Head: Normocephalic and atraumatic.  Cardiovascular:     Rate and Rhythm: Normal rate and regular rhythm.     Heart sounds: Normal heart sounds. No murmur. No friction rub. No gallop.   Pulmonary:     Effort: Pulmonary effort is normal. No respiratory distress.     Breath sounds: Normal breath sounds. No stridor. No wheezing, rhonchi or rales.  Abdominal:     General: Bowel sounds are normal. There is no distension.     Palpations: Abdomen is soft. There is no mass.     Tenderness: There is no abdominal tenderness. There is no guarding or rebound.     Hernia: A hernia is present.     Comments: Reducible umbilical hernia below a superior umbilical incision scar.  Skin:    General: Skin is warm and dry.  Neurological:       Mental Status: She is alert and oriented to person, place, and time.     Assessment  Incisional hernia Plan   Patient is scheduled for incisional herniorrhaphy with mesh on 10/20/2018.  The risks and benefits of the procedure including bleeding, infection, mesh use, the possibility of recurrence of the hernia were fully explained to the patient, gave informed consent.  She will receive subcutaneous Lovenox preoperatively at the time of surgery.  

## 2018-10-13 ENCOUNTER — Encounter: Payer: Self-pay | Admitting: General Surgery

## 2018-10-13 NOTE — Patient Instructions (Signed)
Brianna Russell  10/13/2018     @PREFPERIOPPHARMACY @   Your procedure is scheduled on  10/20/2018  Report to [name] at  720  A.M.  Call this number if you have problems the morning of surgery:  [phone]   Remember:  Do not eat or drink after midnight.                        Take these medicines the morning of surgery with A SIP OF WATER None.    Do not wear jewelry, make-up or nail polish.  Do not wear lotions, powders, or perfumes. Please brush your teeth and wear deodorant.  Do not shave 48 hours prior to surgery.  Men may shave face and neck.  Do not bring valuables to the hospital.  [facility] is not responsible for any belongings or valuables.  Contacts, dentures or bridgework may not be worn into surgery.  Leave your suitcase in the car.  After surgery it may be brought to your room.  For patients admitted to the hospital, discharge time will be determined by your treatment team.  Patients discharged the day of surgery will not be allowed to drive home.   Name and phone number of your driver:   family Special instructions:  None  Please read over the following fact sheets that you were given. Anesthesia Post-op Instructions and Care and Recovery After Surgery       Open Hernia Repair, Adult, Care After These instructions give you information about caring for yourself after your procedure. Your doctor may also give you more specific instructions. If you have problems or questions, contact your doctor. Follow these instructions at home: Surgical cut (incision) care   Follow instructions from your doctor about how to take care of your surgical cut area. Make sure you: ? Wash your hands with soap and water before you change your bandage (dressing). If you cannot use soap and water, use hand sanitizer. ? Change your bandage as told by your doctor. ? Leave stitches (sutures), skin glue, or skin tape (adhesive) strips in place. They may need to stay  in place for 2 weeks or longer. If tape strips get loose and curl up, you may trim the loose edges. Do not remove tape strips completely unless your doctor says it is okay.  Check your surgical cut every day for signs of infection. Check for: ? More redness, swelling, or pain. ? More fluid or blood. ? Warmth. ? Pus or a bad smell. Activity  Do not drive or use heavy machinery while taking prescription pain medicine. Do not drive until your doctor says it is okay.  Until your doctor says it is okay: ? Do not lift anything that is heavier than 10 lb (4.5 kg). ? Do not play contact sports.  Return to your normal activities as told by your doctor. Ask your doctor what activities are safe. General instructions  To prevent or treat having a hard time pooping (constipation) while you are taking prescription pain medicine, your doctor may recommend that you: ? Drink enough fluid to keep your pee (urine) clear or pale yellow. ? Take over-the-counter or prescription medicines. ? Eat foods that are high in fiber, such as fresh fruits and vegetables, whole grains, and beans. ? Limit foods that are high in fat and processed sugars, such as fried and sweet foods.  Take over-the-counter and prescription medicines only as  told by your doctor.  Do not take baths, swim, or use a hot tub until your doctor says it is okay.  Keep all follow-up visits as told by your doctor. This is important. Contact a doctor if:  You develop a rash.  You have more redness, swelling, or pain around your surgical cut.  You have more fluid or blood coming from your surgical cut.  Your surgical cut feels warm to the touch.  You have pus or a bad smell coming from your surgical cut.  You have a fever or chills.  You have blood in your poop (stool).  You have not pooped in 2-3 days.  Medicine does not help your pain. Get help right away if:  You have chest pain or you are short of breath.  You feel  light-headed.  You feel weak and dizzy (feel faint).  You have very bad pain.  You throw up (vomit) and your pain is worse. This information is not intended to replace advice given to you by your health care provider. Make sure you discuss any questions you have with your health care provider. Document Released: 03/19/2014 Document Revised: 06/20/2018 Document Reviewed: 08/10/2015 Elsevier Patient Education  2020 [employer]  General Anesthesia, Adult, Care After This sheet gives you information about how to care for yourself after your procedure. Your health care provider may also give you more specific instructions. If you have problems or questions, contact your health care provider. What can I expect after the procedure? After the procedure, the following side effects are common:  Pain or discomfort at the IV site.  Nausea.  Vomiting.  Sore throat.  Trouble concentrating.  Feeling cold or chills.  Weak or tired.  Sleepiness and fatigue.  Soreness and body aches. These side effects can affect parts of the body that were not involved in surgery. Follow these instructions at home:  For at least 24 hours after the procedure:  Have a responsible adult stay with you. It is important to have someone help care for you until you are awake and alert.  Rest as needed.  Do not: ? Participate in activities in which you could fall or become injured. ? Drive. ? Use heavy machinery. ? Drink alcohol. ? Take sleeping pills or medicines that cause drowsiness. ? Make important decisions or sign legal documents. ? Take care of children on your own. Eating and drinking  Follow any instructions from your health care provider about eating or drinking restrictions.  When you feel hungry, start by eating small amounts of foods that are soft and easy to digest (bland), such as toast. Gradually return to your regular diet.  Drink enough fluid to keep your urine pale yellow.  If  you vomit, rehydrate by drinking water, juice, or clear broth. General instructions  If you have sleep apnea, surgery and certain medicines can increase your risk for breathing problems. Follow instructions from your health care provider about wearing your sleep device: ? Anytime you are sleeping, including during daytime naps. ? While taking prescription pain medicines, sleeping medicines, or medicines that make you drowsy.  Return to your normal activities as told by your health care provider. Ask your health care provider what activities are safe for you.  Take over-the-counter and prescription medicines only as told by your health care provider.  If you smoke, do not smoke without supervision.  Keep all follow-up visits as told by your health care provider. This is important. Contact a health care provider  if:  You have nausea or vomiting that does not get better with medicine.  You cannot eat or drink without vomiting.  You have pain that does not get better with medicine.  You are unable to pass urine.  You develop a skin rash.  You have a fever.  You have redness around your IV site that gets worse. Get help right away if:  You have difficulty breathing.  You have chest pain.  You have blood in your urine or stool, or you vomit blood. Summary  After the procedure, it is common to have a sore throat or nausea. It is also common to feel tired.  Have a responsible adult stay with you for the first 24 hours after general anesthesia. It is important to have someone help care for you until you are awake and alert.  When you feel hungry, start by eating small amounts of foods that are soft and easy to digest (bland), such as toast. Gradually return to your regular diet.  Drink enough fluid to keep your urine pale yellow.  Return to your normal activities as told by your health care provider. Ask your health care provider what activities are safe for you. This  information is not intended to replace advice given to you by your health care provider. Make sure you discuss any questions you have with your health care provider. Document Released: 06/04/2000 Document Revised: 03/01/2017 Document Reviewed: 10/12/2016 Elsevier Patient Education  2020 [employer] How to Use Chlorhexidine for Bathing Chlorhexidine gluconate (CHG) is a germ-killing (antiseptic) solution that is used to clean the skin. It can get rid of the bacteria that normally live on the skin and can keep them away for about 24 hours. To clean your skin with CHG, you may be given:  A CHG solution to use in the shower or as part of a sponge bath.  A prepackaged cloth that contains CHG. Cleaning your skin with CHG may help lower the risk for infection:  While you are staying in the intensive care unit of the hospital.  If you have a vascular access, such as a central line, to provide short-term or long-term access to your veins.  If you have a catheter to drain urine from your bladder.  If you are on a ventilator. A ventilator is a machine that helps you breathe by moving air in and out of your lungs.  After surgery. What are the risks? Risks of using CHG include:  A skin reaction.  Hearing loss, if CHG gets in your ears.  Eye injury, if CHG gets in your eyes and is not rinsed out.  The CHG product catching fire. Make sure that you avoid smoking and flames after applying CHG to your skin. Do not use CHG:  If you have a chlorhexidine allergy or have previously reacted to chlorhexidine.  On babies younger than 442 months of age. How to use CHG solution  Use CHG only as told by your health care provider, and follow the instructions on the label.  Use the full amount of CHG as directed. Usually, this is one bottle. During a shower Follow these steps when using CHG solution during a shower (unless your health care provider gives you different instructions): 1. Start the  shower. 2. Use your normal soap and shampoo to wash your face and hair. 3. Turn off the shower or move out of the shower stream. 4. Pour the CHG onto a clean washcloth. Do not use any type of  brush or rough-edged sponge. 5. Starting at your neck, lather your body down to your toes. Make sure you follow these instructions: ? If you will be having surgery, pay special attention to the part of your body where you will be having surgery. Scrub this area for at least 1 minute. ? Do not use CHG on your head or face. If the solution gets into your ears or eyes, rinse them well with water. ? Avoid your genital area. ? Avoid any areas of skin that have broken skin, cuts, or scrapes. ? Scrub your back and under your arms. Make sure to wash skin folds. 6. Let the lather sit on your skin for 1-2 minutes or as long as told by your health care provider. 7. Thoroughly rinse your entire body in the shower. Make sure that all body creases and crevices are rinsed well. 8. Dry off with a clean towel. Do not put any substances on your body afterward-such as powder, lotion, or perfume-unless you are told to do so by your health care provider. Only use lotions that are recommended by the manufacturer. 9. Put on clean clothes or pajamas. 10. If it is the night before your surgery, sleep in clean sheets.  During a sponge bath Follow these steps when using CHG solution during a sponge bath (unless your health care provider gives you different instructions): 1. Use your normal soap and shampoo to wash your face and hair. 2. Pour the CHG onto a clean washcloth. 3. Starting at your neck, lather your body down to your toes. Make sure you follow these instructions: ? If you will be having surgery, pay special attention to the part of your body where you will be having surgery. Scrub this area for at least 1 minute. ? Do not use CHG on your head or face. If the solution gets into your ears or eyes, rinse them well with  water. ? Avoid your genital area. ? Avoid any areas of skin that have broken skin, cuts, or scrapes. ? Scrub your back and under your arms. Make sure to wash skin folds. 4. Let the lather sit on your skin for 1-2 minutes or as long as told by your health care provider. 5. Using a different clean, wet washcloth, thoroughly rinse your entire body. Make sure that all body creases and crevices are rinsed well. 6. Dry off with a clean towel. Do not put any substances on your body afterward-such as powder, lotion, or perfume-unless you are told to do so by your health care provider. Only use lotions that are recommended by the manufacturer. 7. Put on clean clothes or pajamas. 8. If it is the night before your surgery, sleep in clean sheets. How to use CHG prepackaged cloths  Only use CHG cloths as told by your health care provider, and follow the instructions on the label.  Use the CHG cloth on clean, dry skin.  Do not use the CHG cloth on your head or face unless your health care provider tells you to.  When washing with the CHG cloth: ? Avoid your genital area. ? Avoid any areas of skin that have broken skin, cuts, or scrapes. Before surgery Follow these steps when using a CHG cloth to clean before surgery (unless your health care provider gives you different instructions): 1. Using the CHG cloth, vigorously scrub the part of your body where you will be having surgery. Scrub using a back-and-forth motion for 3 minutes. The area on your body should  be completely wet with CHG when you are done scrubbing. 2. Do not rinse. Discard the cloth and let the area air-dry. Do not put any substances on the area afterward, such as powder, lotion, or perfume. 3. Put on clean clothes or pajamas. 4. If it is the night before your surgery, sleep in clean sheets.  For general bathing Follow these steps when using CHG cloths for general bathing (unless your health care provider gives you different  instructions). 1. Use a separate CHG cloth for each area of your body. Make sure you wash between any folds of skin and between your fingers and toes. Wash your body in the following order, switching to a new cloth after each step: ? The front of your neck, shoulders, and chest. ? Both of your arms, under your arms, and your hands. ? Your stomach and groin area, avoiding the genitals. ? Your right leg and foot. ? Your left leg and foot. ? The back of your neck, your back, and your buttocks. 2. Do not rinse. Discard the cloth and let the area air-dry. Do not put any substances on your body afterward-such as powder, lotion, or perfume-unless you are told to do so by your health care provider. Only use lotions that are recommended by the manufacturer. 3. Put on clean clothes or pajamas. Contact a health care provider if:  Your skin gets irritated after scrubbing.  You have questions about using your solution or cloth. Get help right away if:  Your eyes become very red or swollen.  Your eyes itch badly.  Your skin itches badly and is red or swollen.  Your hearing changes.  You have trouble seeing.  You have swelling or tingling in your mouth or throat.  You have trouble breathing.  You swallow any chlorhexidine. Summary  Chlorhexidine gluconate (CHG) is a germ-killing (antiseptic) solution that is used to clean the skin. Cleaning your skin with CHG may help to lower your risk for infection.  You may be given CHG to use for bathing. It may be in a bottle or in a prepackaged cloth to use on your skin. Carefully follow your health care provider's instructions and the instructions on the product label.  Do not use CHG if you have a chlorhexidine allergy.  Contact your health care provider if your skin gets irritated after scrubbing. This information is not intended to replace advice given to you by your health care provider. Make sure you discuss any questions you have with your  health care provider. Document Released: 11/21/2011 Document Revised: 05/15/2018 Document Reviewed: 01/24/2017 Elsevier Patient Education  2020 [employer].

## 2018-10-16 ENCOUNTER — Encounter (HOSPITAL_COMMUNITY)
Admission: RE | Admit: 2018-10-16 | Discharge: 2018-10-16 | Disposition: A | Discharge status: Home / Self Care | Payer: 59 | Source: Ambulatory Visit | Attending: General Surgery | Admitting: General Surgery

## 2018-10-16 ENCOUNTER — Other Ambulatory Visit: Payer: Self-pay

## 2018-10-16 ENCOUNTER — Other Ambulatory Visit (HOSPITAL_COMMUNITY)
Admission: RE | Admit: 2018-10-16 | Discharge: 2018-10-16 | Disposition: A | Discharge status: Home / Self Care | Payer: 59 | Source: Ambulatory Visit | Attending: General Surgery | Admitting: General Surgery

## 2018-10-16 ENCOUNTER — Encounter (HOSPITAL_COMMUNITY): Payer: Self-pay

## 2018-10-16 DIAGNOSIS — Z20822 Contact with and (suspected) exposure to covid-19: Secondary | ICD-10-CM

## 2018-10-16 DIAGNOSIS — Z20828 Contact with and (suspected) exposure to other viral communicable diseases: Secondary | ICD-10-CM | POA: Insufficient documentation

## 2018-10-16 DIAGNOSIS — Z01812 Encounter for preprocedural laboratory examination: Secondary | ICD-10-CM | POA: Diagnosis present

## 2018-10-16 LAB — CBC WITH DIFFERENTIAL/PLATELET
Abs Immature Granulocytes: 0.01 10*3/uL (ref 0.00–0.07)
Basophils Absolute: 0 10*3/uL (ref 0.0–0.1)
Basophils Relative: 0 %
Eosinophils Absolute: 0.1 10*3/uL (ref 0.0–0.5)
Eosinophils Relative: 2 %
HCT: 41.3 % (ref 36.0–46.0)
Hemoglobin: 13.4 g/dL (ref 12.0–15.0)
Immature Granulocytes: 0 %
Lymphocytes Relative: 21 %
Lymphs Abs: 1.1 10*3/uL (ref 0.7–4.0)
MCH: 29.9 pg (ref 26.0–34.0)
MCHC: 32.4 g/dL (ref 30.0–36.0)
MCV: 92.2 fL (ref 80.0–100.0)
Monocytes Absolute: 0.4 10*3/uL (ref 0.1–1.0)
Monocytes Relative: 8 %
Neutro Abs: 3.7 10*3/uL (ref 1.7–7.7)
Neutrophils Relative %: 69 %
Platelets: 302 10*3/uL (ref 150–400)
RBC: 4.48 MIL/uL (ref 3.87–5.11)
RDW: 12.8 % (ref 11.5–15.5)
WBC: 5.4 10*3/uL (ref 4.0–10.5)
nRBC: 0 % (ref 0.0–0.2)

## 2018-10-16 LAB — HCG, SERUM, QUALITATIVE: Preg, Serum: NEGATIVE

## 2018-10-16 NOTE — Progress Notes (Signed)
   10/16/18 1347  [place]  Have you ever been diagnosed with sleep apnea through a sleep study? No  Do you snore loudly (loud enough to be heard through closed doors)?  1  Do you often feel tired, fatigued, or sleepy during the daytime (such as falling asleep during driving or talking to someone)? 1  Has anyone observed you stop breathing during your sleep? 1  Do you have, or are you being treated for high blood pressure? 0  BMI more than 35 kg/m2? 1  Age > 50 (1-yes) 0  Neck circumference greater than:Female 16 inches or larger, Female 17inches or larger? 0  Female Gender (Yes=1) 0  Obstructive Sleep Apnea Score 4  Score 5 or greater  Results sent to PCP

## 2018-10-17 LAB — SARS CORONAVIRUS 2 (TAT 6-24 HRS): SARS Coronavirus 2: NEGATIVE

## 2018-10-20 ENCOUNTER — Ambulatory Visit (HOSPITAL_COMMUNITY)
Admission: RE | Admit: 2018-10-20 | Discharge: 2018-10-20 | Disposition: A | Discharge status: Home / Self Care | Payer: 59 | Attending: General Surgery | Admitting: General Surgery

## 2018-10-20 ENCOUNTER — Ambulatory Visit (HOSPITAL_COMMUNITY): Payer: 59 | Admitting: Anesthesiology

## 2018-10-20 ENCOUNTER — Telehealth: Payer: Self-pay

## 2018-10-20 ENCOUNTER — Encounter (HOSPITAL_COMMUNITY)
Admission: RE | Disposition: A | Discharge status: Home / Self Care | Payer: Self-pay | Source: Home / Self Care | Attending: General Surgery

## 2018-10-20 DIAGNOSIS — Z86711 Personal history of pulmonary embolism: Secondary | ICD-10-CM | POA: Insufficient documentation

## 2018-10-20 DIAGNOSIS — K432 Incisional hernia without obstruction or gangrene: Secondary | ICD-10-CM | POA: Diagnosis present

## 2018-10-20 HISTORY — PX: INCISIONAL HERNIA REPAIR: SHX193

## 2018-10-20 SURGERY — REPAIR, HERNIA, INCISIONAL
Anesthesia: General | Site: Abdomen

## 2018-10-20 MED ORDER — LIDOCAINE 2% (20 MG/ML) 5 ML SYRINGE
INTRAMUSCULAR | Status: AC
  Filled 2018-10-20: qty 5

## 2018-10-20 MED ORDER — ONDANSETRON HCL 4 MG/2ML IJ SOLN
INTRAMUSCULAR | Status: DC | PRN
  Administered 2018-10-20: 4 mg via INTRAVENOUS

## 2018-10-20 MED ORDER — ONDANSETRON HCL 4 MG/2ML IJ SOLN
INTRAMUSCULAR | Status: AC
  Filled 2018-10-20: qty 2

## 2018-10-20 MED ORDER — HYDROCODONE-ACETAMINOPHEN 7.5-325 MG PO TABS
1.0000 | ORAL_TABLET | Freq: Once | ORAL | Status: AC | PRN
  Administered 2018-10-20: 1 via ORAL
  Filled 2018-10-20: qty 1

## 2018-10-20 MED ORDER — CHLORHEXIDINE GLUCONATE CLOTH 2 % EX PADS: 6.0000 | MEDICATED_PAD | Freq: Once | CUTANEOUS | Status: DC

## 2018-10-20 MED ORDER — 0.9 % SODIUM CHLORIDE (POUR BTL) OPTIME
TOPICAL | Status: DC | PRN
  Administered 2018-10-20: 09:00:00 1000 mL

## 2018-10-20 MED ORDER — SUGAMMADEX SODIUM 500 MG/5ML IV SOLN
INTRAVENOUS | Status: DC | PRN
  Administered 2018-10-20: 500 mg via INTRAVENOUS

## 2018-10-20 MED ORDER — FENTANYL CITRATE (PF) 100 MCG/2ML IJ SOLN
INTRAMUSCULAR | Status: DC | PRN
  Administered 2018-10-20: 150 ug via INTRAVENOUS
  Administered 2018-10-20: 100 ug via INTRAVENOUS

## 2018-10-20 MED ORDER — CEFAZOLIN SODIUM-DEXTROSE 2-4 GM/100ML-% IV SOLN
INTRAVENOUS | Status: AC
  Filled 2018-10-20: qty 100

## 2018-10-20 MED ORDER — HYDROMORPHONE HCL 1 MG/ML IJ SOLN
INTRAMUSCULAR | Status: AC
  Filled 2018-10-20: qty 0.5

## 2018-10-20 MED ORDER — SUCCINYLCHOLINE CHLORIDE 200 MG/10ML IV SOSY
PREFILLED_SYRINGE | INTRAVENOUS | Status: AC
  Filled 2018-10-20: qty 10

## 2018-10-20 MED ORDER — ENOXAPARIN SODIUM 40 MG/0.4ML ~~LOC~~ SOLN
SUBCUTANEOUS | Status: AC
  Filled 2018-10-20: qty 0.4

## 2018-10-20 MED ORDER — SUCCINYLCHOLINE CHLORIDE 20 MG/ML IJ SOLN
INTRAMUSCULAR | Status: DC | PRN
  Administered 2018-10-20: 140 mg via INTRAVENOUS

## 2018-10-20 MED ORDER — DEXAMETHASONE SODIUM PHOSPHATE 10 MG/ML IJ SOLN
INTRAMUSCULAR | Status: AC
  Filled 2018-10-20: qty 1

## 2018-10-20 MED ORDER — PROPOFOL 10 MG/ML IV BOLUS
INTRAVENOUS | Status: DC | PRN
  Administered 2018-10-20: 200 mg via INTRAVENOUS

## 2018-10-20 MED ORDER — BUPIVACAINE LIPOSOME 1.3 % IJ SUSP
INTRAMUSCULAR | Status: DC | PRN
  Administered 2018-10-20: 20 mL

## 2018-10-20 MED ORDER — ROCURONIUM BROMIDE 10 MG/ML (PF) SYRINGE
PREFILLED_SYRINGE | INTRAVENOUS | Status: AC
  Filled 2018-10-20: qty 10

## 2018-10-20 MED ORDER — OXYCODONE-ACETAMINOPHEN 7.5-325 MG PO TABS: 1.0000 | ORAL_TABLET | Freq: Four times a day (QID) | ORAL | 0 refills | Status: AC | PRN

## 2018-10-20 MED ORDER — PROPOFOL 10 MG/ML IV BOLUS
INTRAVENOUS | Status: AC
  Filled 2018-10-20: qty 20

## 2018-10-20 MED ORDER — PROMETHAZINE HCL 25 MG/ML IJ SOLN: 6.2500 mg | INTRAMUSCULAR | Status: DC | PRN

## 2018-10-20 MED ORDER — MIDAZOLAM HCL 2 MG/2ML IJ SOLN: 0.5000 mg | Freq: Once | INTRAMUSCULAR | Status: DC | PRN

## 2018-10-20 MED ORDER — DEXAMETHASONE SODIUM PHOSPHATE 4 MG/ML IJ SOLN
INTRAMUSCULAR | Status: DC | PRN
  Administered 2018-10-20: 6 mg via INTRAVENOUS

## 2018-10-20 MED ORDER — FENTANYL CITRATE (PF) 250 MCG/5ML IJ SOLN
INTRAMUSCULAR | Status: AC
  Filled 2018-10-20: qty 5

## 2018-10-20 MED ORDER — MIDAZOLAM HCL 2 MG/2ML IJ SOLN
INTRAMUSCULAR | Status: AC
  Filled 2018-10-20: qty 2

## 2018-10-20 MED ORDER — HYDROMORPHONE HCL 1 MG/ML IJ SOLN
0.2500 mg | INTRAMUSCULAR | Status: DC | PRN
  Administered 2018-10-20 (×4): 0.5 mg via INTRAVENOUS

## 2018-10-20 MED ORDER — MIDAZOLAM HCL 5 MG/5ML IJ SOLN
INTRAMUSCULAR | Status: DC | PRN
  Administered 2018-10-20: 2 mg via INTRAVENOUS

## 2018-10-20 MED ORDER — LACTATED RINGERS IV SOLN
INTRAVENOUS | Status: DC | PRN
  Administered 2018-10-20: 09:00:00 via INTRAVENOUS

## 2018-10-20 MED ORDER — ENOXAPARIN SODIUM 40 MG/0.4ML ~~LOC~~ SOLN
40.0000 mg | Freq: Once | SUBCUTANEOUS | Status: AC
  Administered 2018-10-20: 40 mg via SUBCUTANEOUS

## 2018-10-20 MED ORDER — SUGAMMADEX SODIUM 500 MG/5ML IV SOLN
INTRAVENOUS | Status: AC
  Filled 2018-10-20: qty 5

## 2018-10-20 MED ORDER — BUPIVACAINE LIPOSOME 1.3 % IJ SUSP
INTRAMUSCULAR | Status: AC
  Filled 2018-10-20: qty 20

## 2018-10-20 MED ORDER — ROCURONIUM BROMIDE 100 MG/10ML IV SOLN
INTRAVENOUS | Status: DC | PRN
  Administered 2018-10-20: 5 mg via INTRAVENOUS
  Administered 2018-10-20: 50 mg via INTRAVENOUS

## 2018-10-20 MED ORDER — DEXTROSE 5 % IV SOLN
3.0000 g | INTRAVENOUS | Status: AC
  Administered 2018-10-20: 3 g via INTRAVENOUS

## 2018-10-20 MED ORDER — LACTATED RINGERS IV SOLN: INTRAVENOUS | Status: DC

## 2018-10-20 SURGICAL SUPPLY — 35 items
BLADE SURG SZ11 CARB STEEL (BLADE) ×3 IMPLANT
CHLORAPREP W/TINT 26 (MISCELLANEOUS) ×3 IMPLANT
CLOTH BEACON ORANGE TIMEOUT ST (SAFETY) ×3 IMPLANT
COVER LIGHT HANDLE STERIS (MISCELLANEOUS) ×6 IMPLANT
DERMABOND ADVANCED (GAUZE/BANDAGES/DRESSINGS) ×2
DERMABOND ADVANCED .7 DNX12 (GAUZE/BANDAGES/DRESSINGS) ×1 IMPLANT
ELECT REM PT RETURN 9FT ADLT (ELECTROSURGICAL) ×3
ELECTRODE REM PT RTRN 9FT ADLT (ELECTROSURGICAL) ×1 IMPLANT
GAUZE SPONGE 4X4 12PLY STRL (GAUZE/BANDAGES/DRESSINGS) ×3 IMPLANT
GLOVE BIOGEL M 7.0 STRL (GLOVE) ×6 IMPLANT
GLOVE BIOGEL PI IND STRL 6.5 (GLOVE) ×2 IMPLANT
GLOVE BIOGEL PI IND STRL 7.0 (GLOVE) ×3 IMPLANT
GLOVE BIOGEL PI INDICATOR 6.5 (GLOVE) ×4
GLOVE BIOGEL PI INDICATOR 7.0 (GLOVE) ×6
GLOVE ECLIPSE 6.5 STRL STRAW (GLOVE) ×6 IMPLANT
GLOVE SURG SS PI 7.5 STRL IVOR (GLOVE) ×3 IMPLANT
GOWN STRL REUS W/ TWL LRG LVL3 (GOWN DISPOSABLE) ×1 IMPLANT
GOWN STRL REUS W/TWL LRG LVL3 (GOWN DISPOSABLE) ×14 IMPLANT
INST SET MINOR GENERAL (KITS) ×3 IMPLANT
KIT TURNOVER KIT A (KITS) ×3 IMPLANT
LIGASURE IMPACT 36 18CM CVD LR (INSTRUMENTS) ×3 IMPLANT
MANIFOLD NEPTUNE II (INSTRUMENTS) ×3 IMPLANT
MESH VENTRALEX ST 2.5 CRC MED (Mesh General) ×3 IMPLANT
NEEDLE HYPO 21X1.5 SAFETY (NEEDLE) ×3 IMPLANT
NS IRRIG 1000ML POUR BTL (IV SOLUTION) ×3 IMPLANT
PACK MINOR (CUSTOM PROCEDURE TRAY) ×3 IMPLANT
PAD ARMBOARD 7.5X6 YLW CONV (MISCELLANEOUS) ×3 IMPLANT
SET BASIN LINEN APH (SET/KITS/TRAYS/PACK) ×3 IMPLANT
SUT ETHIBOND 0 MO6 C/R (SUTURE) ×3 IMPLANT
SUT VIC AB 2-0 CT1 27 (SUTURE) ×2
SUT VIC AB 2-0 CT1 TAPERPNT 27 (SUTURE) ×1 IMPLANT
SUT VIC AB 3-0 SH 27 (SUTURE) ×2
SUT VIC AB 3-0 SH 27X BRD (SUTURE) ×1 IMPLANT
SYR 20ML LL LF (SYRINGE) ×6 IMPLANT
SYR 30ML LL (SYRINGE) ×3 IMPLANT

## 2018-10-20 NOTE — Anesthesia Postprocedure Evaluation (Signed)
Anesthesia Post Note  Patient: Brianna Russell  Procedure(s) Performed: [facility] [place] WITH MESH (N/A Abdomen)  Patient location during evaluation: PACU Anesthesia Type: General Level of consciousness: awake and alert, oriented and patient cooperative Pain management: pain level controlled Vital Signs Assessment: post-procedure vital signs reviewed and stable Respiratory status: spontaneous breathing, respiratory function stable and patient connected to face mask oxygen Cardiovascular status: stable Postop Assessment: no apparent nausea or vomiting Anesthetic complications: no     Last Vitals:  Vitals:   10/20/18 0745  BP: (!) 135/92  Temp: 37.1 C  SpO2: 98%    Last Pain:  Vitals:   10/20/18 0745  PainSc: 0-No pain                 Chris Narasimhan

## 2018-10-20 NOTE — Op Note (Signed)
Patient:  Brianna Russell  DOB:  Oct 26, 1990  MRN:  [id number]   Preop Diagnosis: Incisional hernia  Postop Diagnosis: Same  Procedure: Incisional herniorrhaphy with mesh  Surgeon: Aviva Signs, MD  Anes: General endotracheal  Indications: Patient is a 28 year old white female who presents with an incisional hernia from a previous laparoscopic cholecystectomy at the umbilicus.  The risks and benefits of the procedure including bleeding, infection, mesh use, and the possibility of recurrence of the hernia were fully explained to the patient, who gave informed consent.  Procedure note: The patient was placed in supine position.  After induction of general endotracheal anesthesia, the abdomen was prepped and draped using the usual sterile technique with ChloraPrep.  Surgical site confirmation was performed.  An infraumbilical incision was made down to the fascia.  The umbilicus was freed away from the hernia sac.  The hernia sac contained omentum.  In order to facilitate reduction of the omentum, a LigaSure was used to remove some of the excess omentum.  The omentum was then reduced without difficulty.  The hernia sac was excised down to the fascia.  The resultant defect was approximately 2 to 3 cm in its greatest diameter.  I did palpate the underside of the ventral wall and no other hernia defects were identified.  A 6.4 cm Bard Ventralax ST patch was then inserted and secured to the fascia using 0 Ethibond interrupted sutures.  The overlying fascia was reapproximated transversely using 0 Ethibond interrupted sutures.  The base the umbilicus was secured back to the fascia using a 2-0 Vicryl interrupted suture.  The subcutaneous layer was reapproximated using a 3-0 Vicryl interrupted suture.  Exparel was instilled into the surrounding wound.  The skin was closed using a 4-0 Monocryl subcuticular suture.  Dermabond was applied.  All tape and needle counts were correct at the end of the procedure.   The patient was extubated in the operating room and transferred to PACU in stable condition.  Complications: None  EBL: None  Specimen: None

## 2018-10-20 NOTE — Anesthesia Procedure Notes (Signed)
Procedure Name: Intubation Date/Time: 10/20/2018 9:12 AM Performed by: Jonna Munro, CRNA Pre-anesthesia Checklist: Patient identified, Emergency Drugs available, Suction available, Patient being monitored and Timeout performed Patient Re-evaluated:Patient Re-evaluated prior to induction Oxygen Delivery Method: Circle system utilized Preoxygenation: Pre-oxygenation with 100% oxygen Induction Type: IV induction, Rapid sequence and Cricoid Pressure applied Laryngoscope Size: Mac and 3 Grade View: Grade I Tube type: Oral Tube size: 7.0 mm Number of attempts: 1 Airway Equipment and Method: Stylet Dental Injury: Teeth and Oropharynx as per pre-operative assessment

## 2018-10-20 NOTE — Telephone Encounter (Signed)
Patient called the office after discharge from hospital post surgery stating that her eyes were red and seemed like they were bleeding. MD notified and suggested using visine as she may have scratched her cornea. Patient was told if she has any vision loss to go to nearest ED. Patient agreed.

## 2018-10-20 NOTE — Discharge Instructions (Signed)
Open Hernia Repair, Adult, Care After °This sheet gives you information about how to care for yourself after your procedure. Your health care provider may also give you more specific instructions. If you have problems or questions, contact your health care provider. °What can I expect after the procedure? °After the procedure, it is common to have: °· Mild discomfort. °· Slight bruising. °· Minor swelling. °· Pain in the abdomen. °Follow these instructions at home: °Incision care ° °· Follow instructions from your health care provider about how to take care of your incision area. Make sure you: °? Wash your hands with soap and water before you change your bandage (dressing). If soap and water are not available, use hand sanitizer. °? Change your dressing as told by your health care provider. °? Leave stitches (sutures), skin glue, or adhesive strips in place. These skin closures may need to stay in place for 2 weeks or longer. If adhesive strip edges start to loosen and curl up, you may trim the loose edges. Do not remove adhesive strips completely unless your health care provider tells you to do that. °· Check your incision area every day for signs of infection. Check for: °? More redness, swelling, or pain. °? More fluid or blood. °? Warmth. °? Pus or a bad smell. °Activity °· Do not drive or use heavy machinery while taking prescription pain medicine. Do not drive until your health care provider approves. °· Until your health care provider approves: °? Do not lift anything that is heavier than 10 lb (4.5 kg). °? Do not play contact sports. °· Return to your normal activities as told by your health care provider. Ask your health care provider what activities are safe. °General instructions °· To prevent or treat constipation while you are taking prescription pain medicine, your health care provider may recommend that you: °? Drink enough fluid to keep your urine clear or pale yellow. °? Take over-the-counter or  prescription medicines. °? Eat foods that are high in fiber, such as fresh fruits and vegetables, whole grains, and beans. °? Limit foods that are high in fat and processed sugars, such as fried and sweet foods. °· Take over-the-counter and prescription medicines only as told by your health care provider. °· Do not take tub baths or go swimming until your health care provider approves. °· Keep all follow-up visits as told by your health care provider. This is important. °Contact a health care provider if: °· You develop a rash. °· You have more redness, swelling, or pain around your incision. °· You have more fluid or blood coming from your incision. °· Your incision feels warm to the touch. °· You have pus or a bad smell coming from your incision. °· You have a fever or chills. °· You have blood in your stool (feces). °· You have not had a bowel movement in 2-3 days. °· Your pain is not controlled with medicine. °Get help right away if: °· You have chest pain or shortness of breath. °· You feel light-headed or feel faint. °· You have severe pain. °· You vomit and your pain is worse. °This information is not intended to replace advice given to you by your health care provider. Make sure you discuss any questions you have with your health care provider. °Document Released: 09/15/2004 Document Revised: 02/08/2017 Document Reviewed: 08/10/2015 °Elsevier Patient Education © 2020 [employer] ° ° ° ° °General Anesthesia, Adult, Care After °This sheet gives you information about how to care for   yourself after your procedure. Your health care provider may also give you more specific instructions. If you have problems or questions, contact your health care provider. °What can I expect after the procedure? °After the procedure, the following side effects are common: °· Pain or discomfort at the IV site. °· Nausea. °· Vomiting. °· Sore throat. °· Trouble concentrating. °· Feeling cold or chills. °· Weak or  tired. °· Sleepiness and fatigue. °· Soreness and body aches. These side effects can affect parts of the body that were not involved in surgery. °Follow these instructions at home: ° °For at least 24 hours after the procedure: °· Have a responsible adult stay with you. It is important to have someone help care for you until you are awake and alert. °· Rest as needed. °· Do not: °? Participate in activities in which you could fall or become injured. °? Drive. °? Use heavy machinery. °? Drink alcohol. °? Take sleeping pills or medicines that cause drowsiness. °? Make important decisions or sign legal documents. °? Take care of children on your own. °Eating and drinking °· Follow any instructions from your health care provider about eating or drinking restrictions. °· When you feel hungry, start by eating small amounts of foods that are soft and easy to digest (bland), such as toast. Gradually return to your regular diet. °· Drink enough fluid to keep your urine pale yellow. °· If you vomit, rehydrate by drinking water, juice, or clear broth. °General instructions °· If you have sleep apnea, surgery and certain medicines can increase your risk for breathing problems. Follow instructions from your health care provider about wearing your sleep device: °? Anytime you are sleeping, including during daytime naps. °? While taking prescription pain medicines, sleeping medicines, or medicines that make you drowsy. °· Return to your normal activities as told by your health care provider. Ask your health care provider what activities are safe for you. °· Take over-the-counter and prescription medicines only as told by your health care provider. °· If you smoke, do not smoke without supervision. °· Keep all follow-up visits as told by your health care provider. This is important. °Contact a health care provider if: °· You have nausea or vomiting that does not get better with medicine. °· You cannot eat or drink without  vomiting. °· You have pain that does not get better with medicine. °· You are unable to pass urine. °· You develop a skin rash. °· You have a fever. °· You have redness around your IV site that gets worse. °Get help right away if: °· You have difficulty breathing. °· You have chest pain. °· You have blood in your urine or stool, or you vomit blood. °Summary °· After the procedure, it is common to have a sore throat or nausea. It is also common to feel tired. °· Have a responsible adult stay with you for the first 24 hours after general anesthesia. It is important to have someone help care for you until you are awake and alert. °· When you feel hungry, start by eating small amounts of foods that are soft and easy to digest (bland), such as toast. Gradually return to your regular diet. °· Drink enough fluid to keep your urine pale yellow. °· Return to your normal activities as told by your health care provider. Ask your health care provider what activities are safe for you. °This information is not intended to replace advice given to you by your health care provider. Make sure   you discuss any questions you have with your health care provider. °Document Released: 06/04/2000 Document Revised: 03/01/2017 Document Reviewed: 10/12/2016 °Elsevier Patient Education © 2020 Elsevier Inc. ° °

## 2018-10-20 NOTE — Transfer of Care (Signed)
Immediate Anesthesia Transfer of Care Note  Patient: Brianna Russell  Procedure(s) Performed: [place] WITH MESH (N/A Abdomen)  Patient Location: PACU  Anesthesia Type:General  Level of Consciousness: awake, alert  and oriented  Airway & Oxygen Therapy: Patient Spontanous Breathing and Patient connected to face mask oxygen  Post-op Assessment: Report given to RN and Post -op Vital signs reviewed and stable  Post vital signs: Reviewed and stable  Last Vitals:  Vitals Value Taken Time  BP    Temp    Pulse    Resp    SpO2      Last Pain:  Vitals:   10/20/18 0745  PainSc: 0-No pain         Complications: No apparent anesthesia complications

## 2018-10-20 NOTE — Anesthesia Preprocedure Evaluation (Addendum)
Anesthesia Evaluation  Patient identified by MRN, date of birth, ID band Patient awake    Reviewed: Allergy & Precautions, NPO status , Patient's Chart, lab work & pertinent test results  Airway Mallampati: II  TM Distance: >3 FB Neck ROM: Full    Dental no notable dental hx. (+) Teeth Intact   Pulmonary neg pulmonary ROS,    Pulmonary exam normal breath sounds clear to auscultation       Cardiovascular Exercise Tolerance: Good negative cardio ROS Normal cardiovascular examI Rhythm:Regular Rate:Normal  Reports h/o Bilat PE Now off anticoagulant  Denies CP/DOE Does report decreased ET- prob secondary to BMI States PE w/u revealed a lupus like condition-no meds    Neuro/Psych PSYCHIATRIC DISORDERS Anxiety Depression negative neurological ROS     GI/Hepatic negative GI ROS, Neg liver ROS,   Endo/Other  diabetesMorbid obesityBMI >45  Renal/GU negative Renal ROS  negative genitourinary   Musculoskeletal negative musculoskeletal ROS (+)   Abdominal   Peds negative pediatric ROS (+)  Hematology negative hematology ROS (+) anemia ,   Anesthesia Other Findings   Reproductive/Obstetrics negative OB ROS                            Anesthesia Physical Anesthesia Plan  ASA: III  Anesthesia Plan: General   Post-op Pain Management:    Induction: Intravenous  PONV Risk Score and Plan: 3 and Dexamethasone, Ondansetron and Treatment may vary due to age or medical condition  Airway Management Planned:   Additional Equipment:   Intra-op Plan:   Post-operative Plan: Extubation in OR  Informed Consent: I have reviewed the patients History and Physical, chart, labs and discussed the procedure including the risks, benefits and alternatives for the proposed anesthesia with the patient or authorized representative who has indicated his/her understanding and acceptance.     Dental advisory  given  Plan Discussed with: CRNA  Anesthesia Plan Comments: (Plan Full PPE use  Plan GETA)        Anesthesia Quick Evaluation

## 2018-10-20 NOTE — Interval H&P Note (Signed)
History and Physical Interval Note:  10/20/2018 8:33 AM  Laural Roes  has presented today for surgery, with the diagnosis of incisional hernia.  The various methods of treatment have been discussed with the patient and family. After consideration of risks, benefits and other options for treatment, the patient has consented to  Procedure(s): [place] (N/A) as a surgical intervention.  The patient's history has been reviewed, patient examined, no change in status, stable for surgery.  I have reviewed the patient's chart and labs.  Questions were answered to the patient's satisfaction.     Aviva Signs

## 2018-10-21 ENCOUNTER — Encounter (HOSPITAL_COMMUNITY): Payer: Self-pay | Admitting: General Surgery

## 2018-10-21 ENCOUNTER — Telehealth (INDEPENDENT_AMBULATORY_CARE_PROVIDER_SITE_OTHER): Payer: Self-pay | Admitting: General Surgery

## 2018-10-21 DIAGNOSIS — Z09 Encounter for follow-up examination after completed treatment for conditions other than malignant neoplasm: Secondary | ICD-10-CM

## 2018-10-21 NOTE — Telephone Encounter (Signed)
Called patient.  States she has no vision difficulties or eye pain.  Has small hematoma in eye that has not increased in size.  I told her this will resolve in time.  Will call her next week for follow up.

## 2018-10-30 ENCOUNTER — Telehealth (INDEPENDENT_AMBULATORY_CARE_PROVIDER_SITE_OTHER): Payer: Self-pay | Admitting: General Surgery

## 2018-10-30 ENCOUNTER — Telehealth: Payer: Self-pay | Admitting: General Surgery

## 2018-10-30 ENCOUNTER — Other Ambulatory Visit: Payer: Self-pay

## 2018-10-30 DIAGNOSIS — Z09 Encounter for follow-up examination after completed treatment for conditions other than malignant neoplasm: Secondary | ICD-10-CM

## 2018-10-30 NOTE — Telephone Encounter (Signed)
Telephone virtual postoperative visit performed.  Patient states she is doing well.  Her eye irritation is resolving.  She has no abdominal pain.  Her incision is healed well.  She is pleased with the results.  She was instructed to call the office should any problems arise.

## 2018-11-28 ENCOUNTER — Other Ambulatory Visit: Payer: Self-pay

## 2018-11-28 ENCOUNTER — Ambulatory Visit (HOSPITAL_COMMUNITY)
Admission: RE | Admit: 2018-11-28 | Discharge: 2018-11-28 | Disposition: A | Discharge status: Home / Self Care | Payer: 59 | Source: Ambulatory Visit | Attending: Family Medicine | Admitting: Family Medicine

## 2018-11-28 ENCOUNTER — Other Ambulatory Visit: Payer: Self-pay | Admitting: Family Medicine

## 2018-11-28 DIAGNOSIS — M79605 Pain in left leg: Secondary | ICD-10-CM

## 2019-03-20 IMAGING — DX DG CHEST 2V
2 series · 2 of 2 positions shown · non-contrast
Comparison: Chest radiograph September 29, 2015

CLINICAL DATA: Nausea, vomiting and chest pain. History of
pulmonary embolism, pleurisy.

EXAM:
CHEST  2 VIEW

[chest pa]
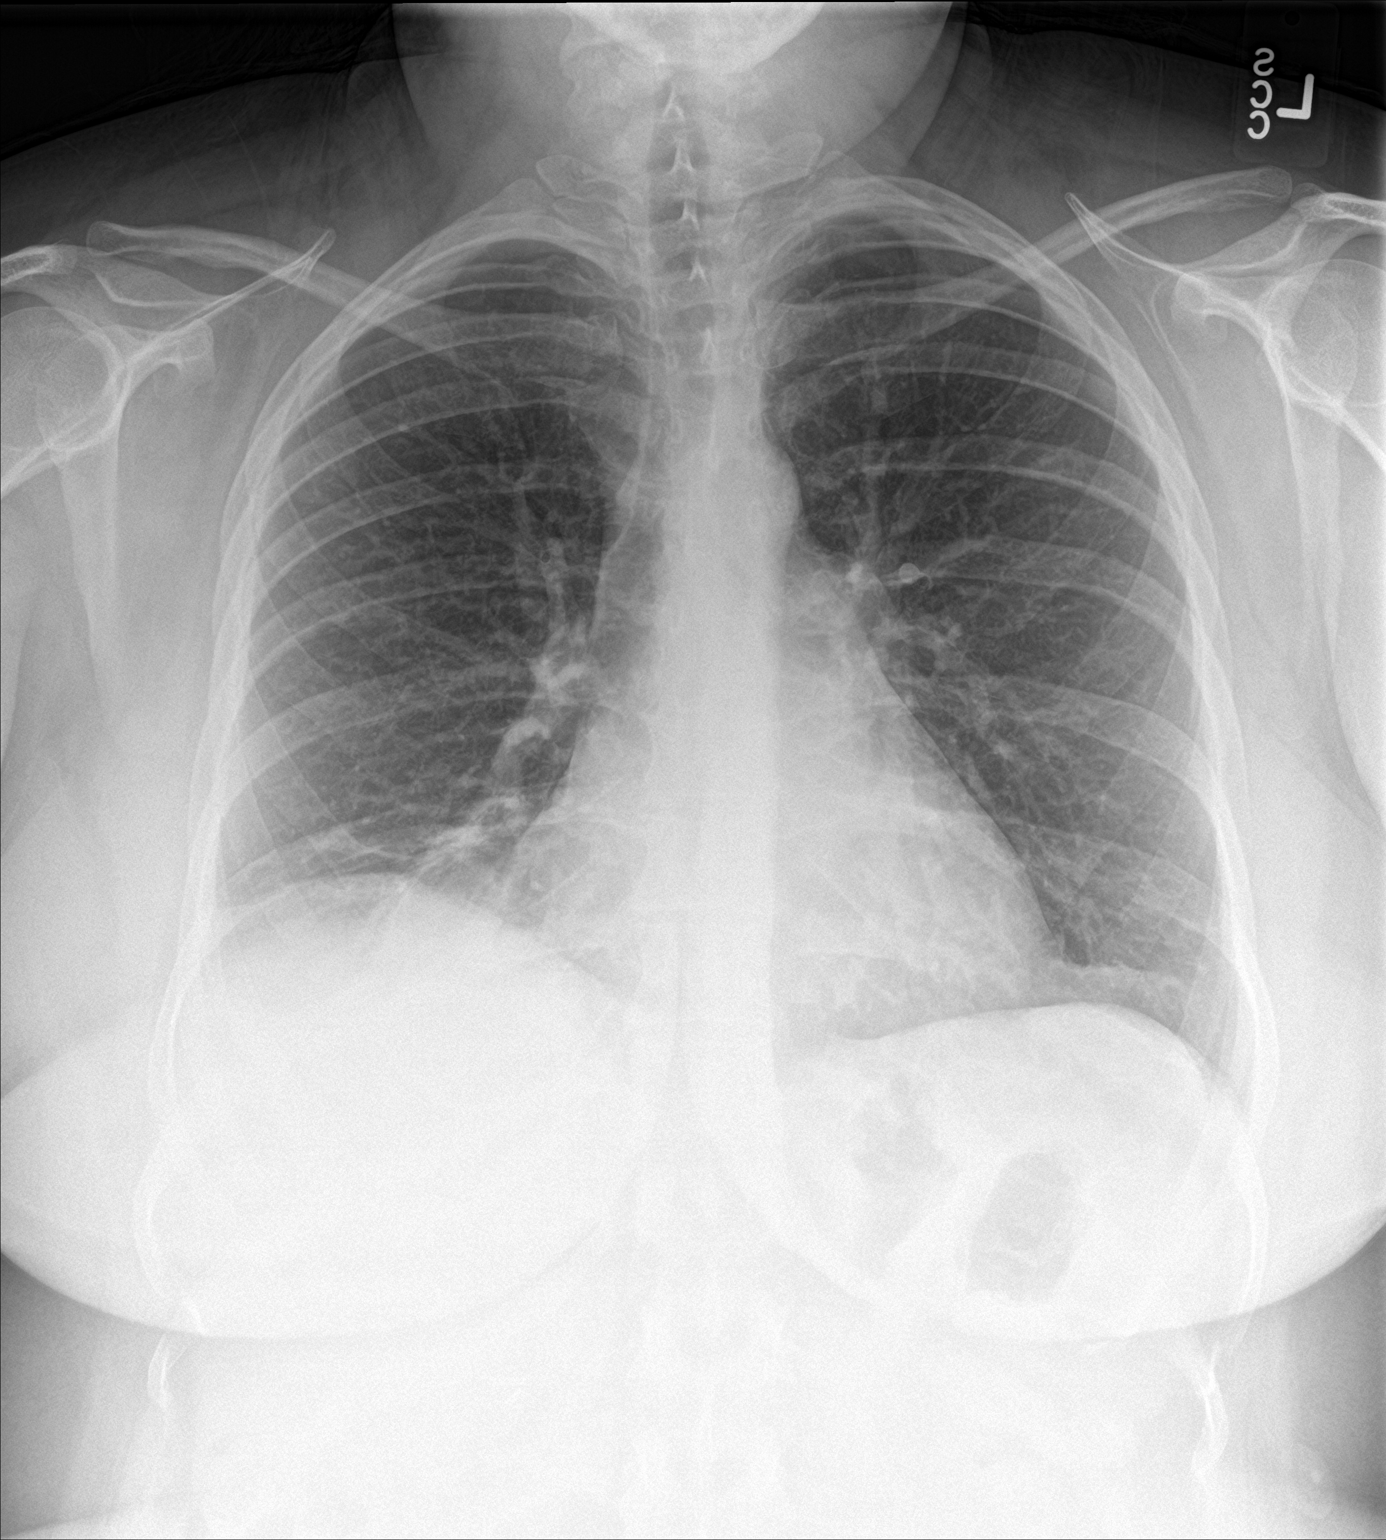

[chest lat]
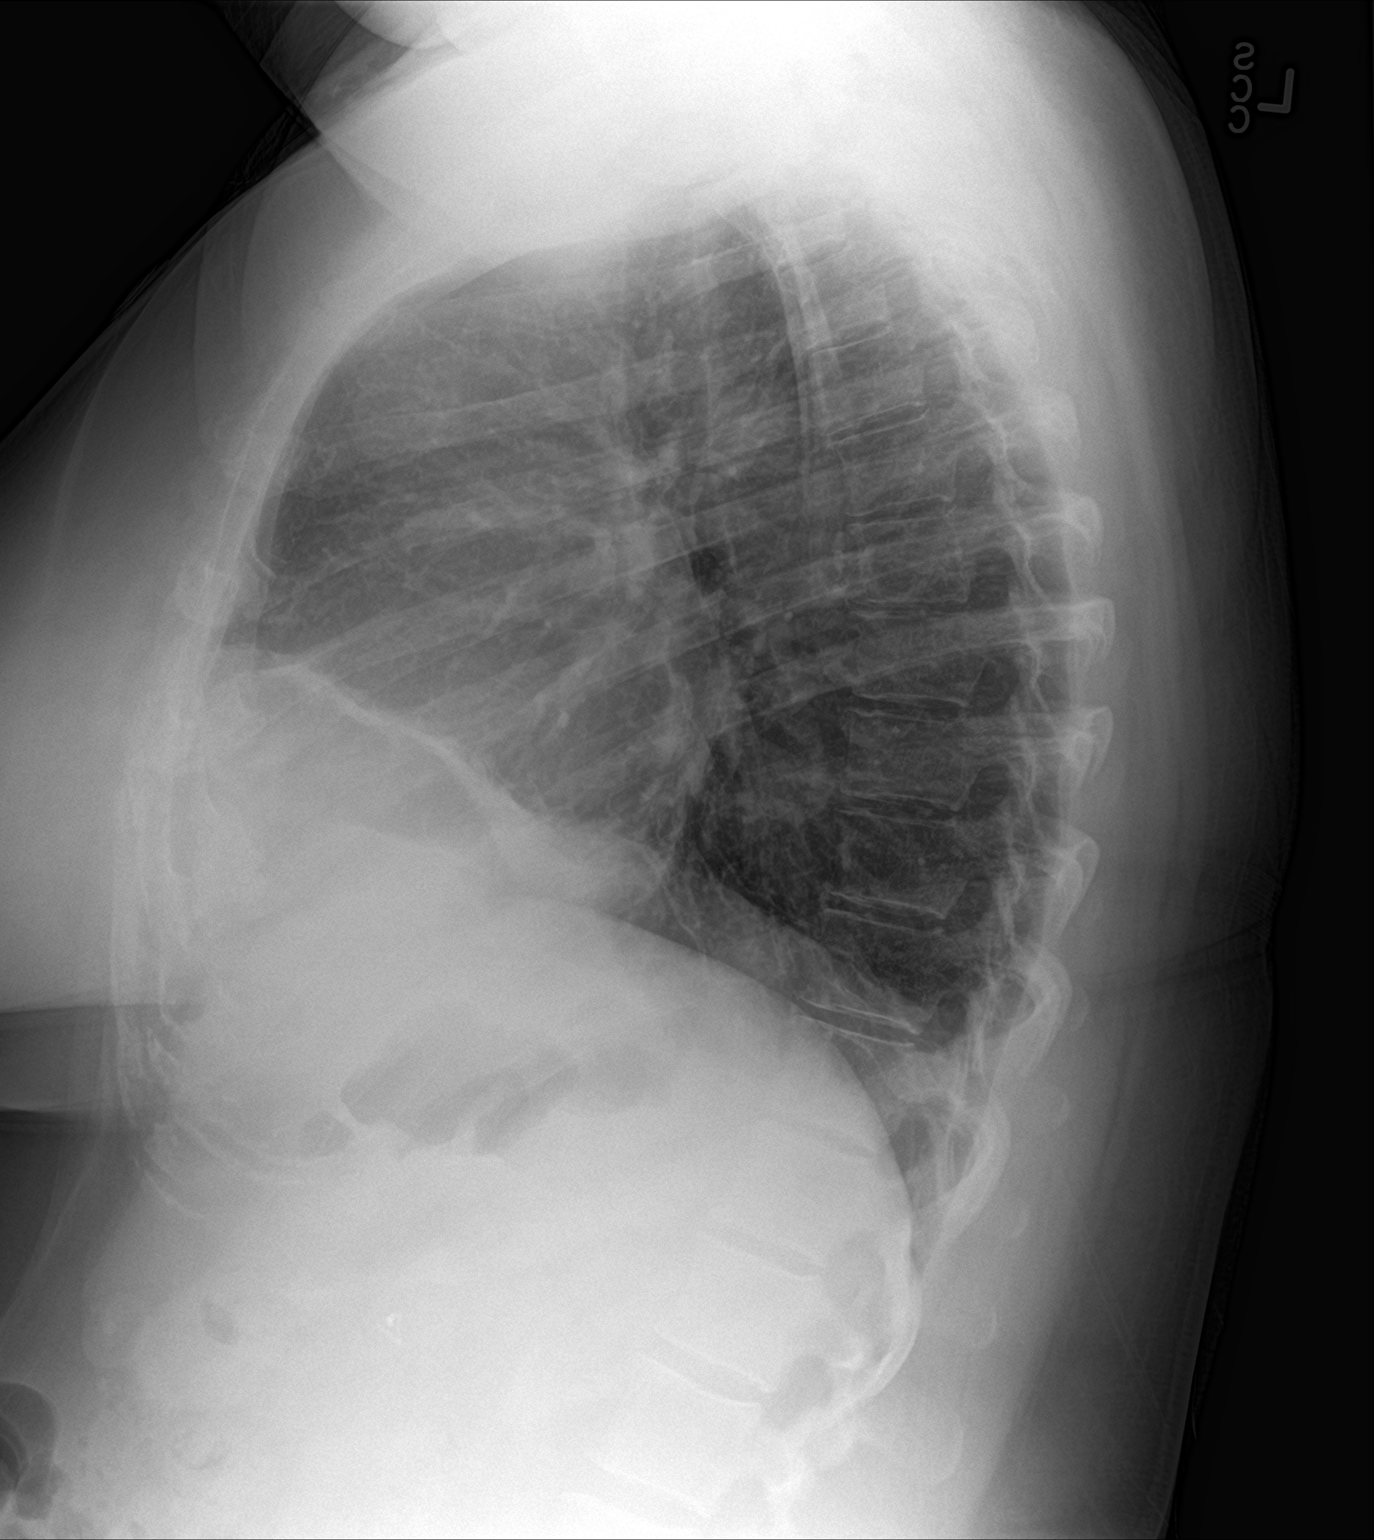

[2 of 2 positions shown; findings below may reference images not displayed]

FINDINGS: The cardiomediastinal silhouette is normal. Bibasilar strandy
densities with mildly elevated RIGHT hemidiaphragm. No pleural
effusion. No pneumothorax. Large body habitus. Surgical clips in the
included right abdomen compatible with cholecystectomy. Osseous
structures are nonsuspicious.
IMPRESSION: Bibasilar atelectasis/scarring.

## 2019-03-21 IMAGING — CT CT ANGIO CHEST
2 of 6 series · 18 of 46 positions shown · IV contrast (APPLIED)
Comparison: Chest radiograph performed 09/15/2016, and CTA of the
chest performed 09/03/2015

CLINICAL DATA: Acute onset of dyspnea and nausea. Initial
encounter.

EXAM:
CT ANGIOGRAPHY CHEST WITH CONTRAST
TECHNIQUE: Multidetector CT imaging of the chest was performed using the
standard protocol during bolus administration of intravenous
contrast. Multiplanar CT image reconstructions and MIPs were
obtained to evaluate the vascular anatomy.
CONTRAST:  100 mL of Isovue 370 IV contrast

[Series 7: thins · axial · 0.76mm/px · z∈[+949,+1145]mm · 15 of 308 slices shown]
[im 14/308  lung]
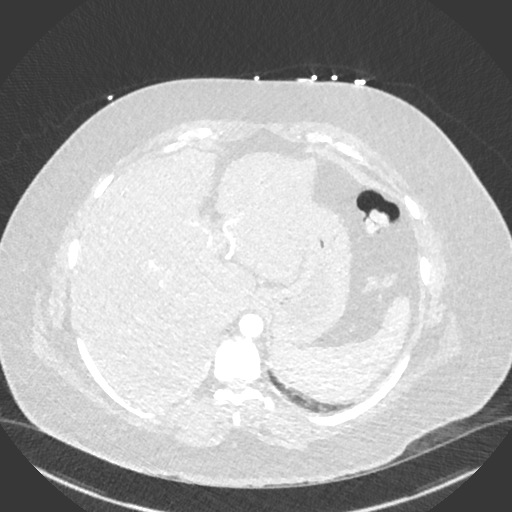
[im 41/308  soft-tissue]
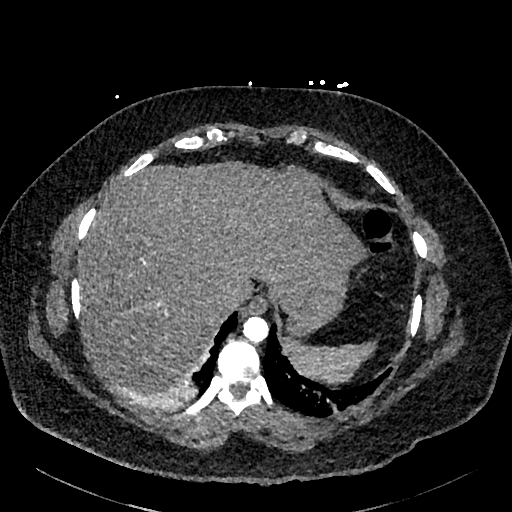
[im 54/308  lung]
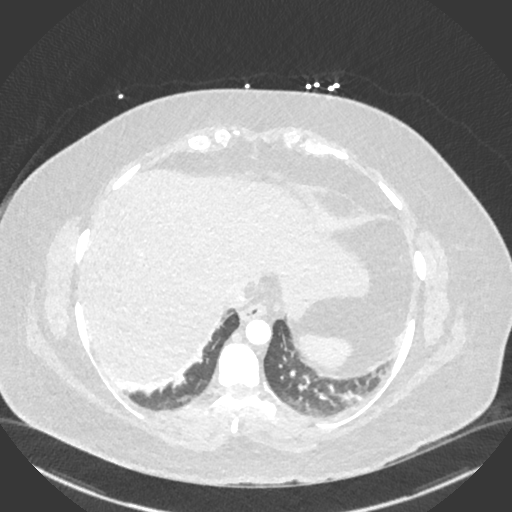
[im 81/308  soft-tissue]
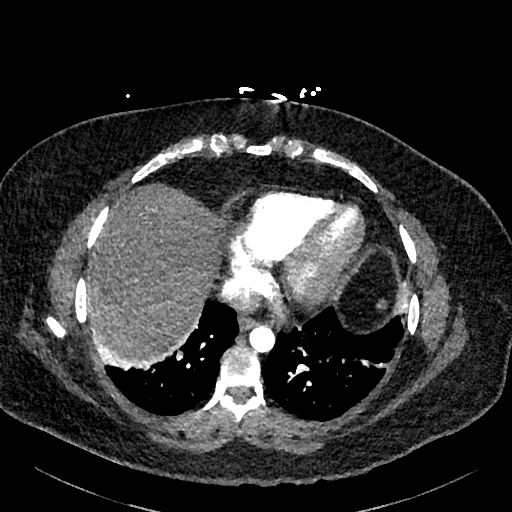
[im 94/308  lung]
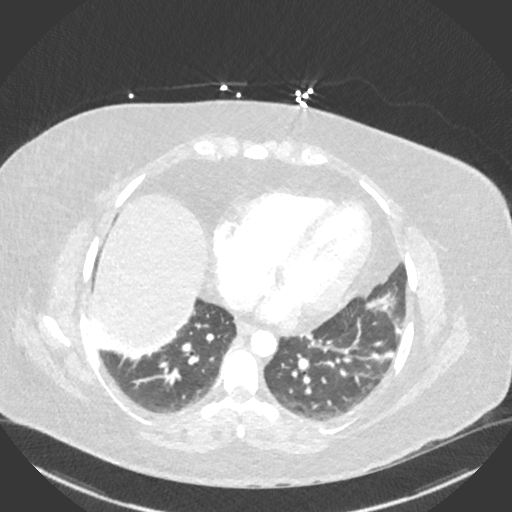
[im 121/308  soft-tissue]
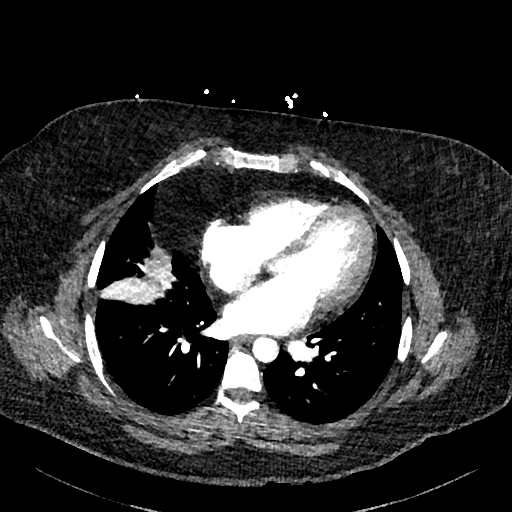
[im 134/308  lung]
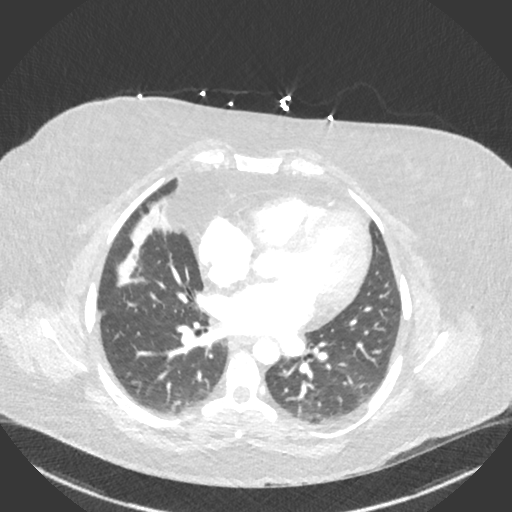
[im 161/308  soft-tissue]
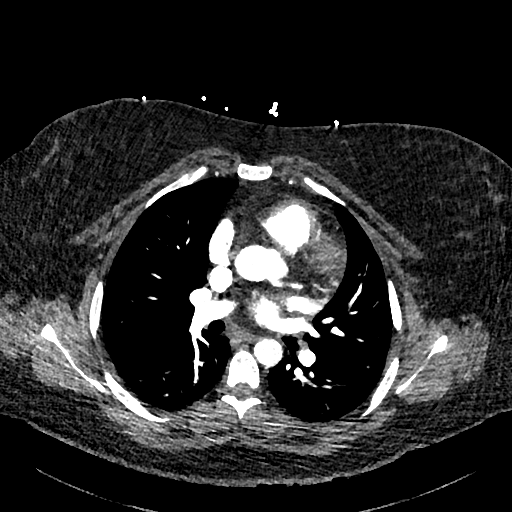
[im 174/308  lung]
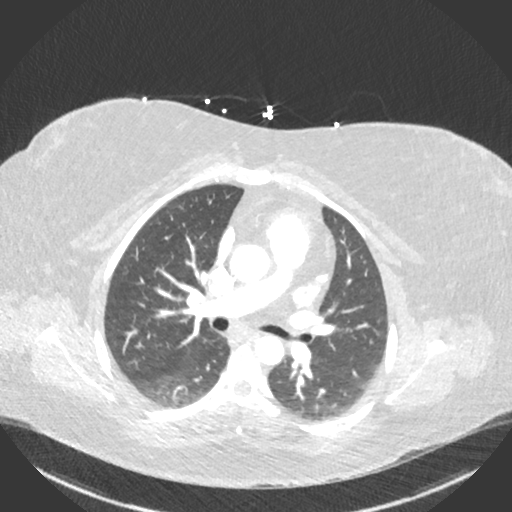
[im 187/308  soft-tissue]
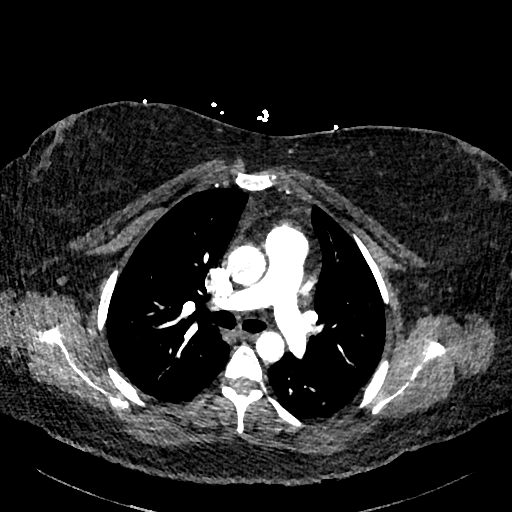
[im 214/308  lung]
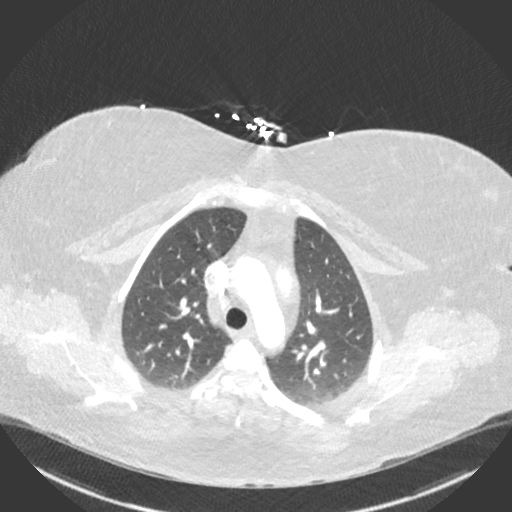
[im 227/308  soft-tissue]
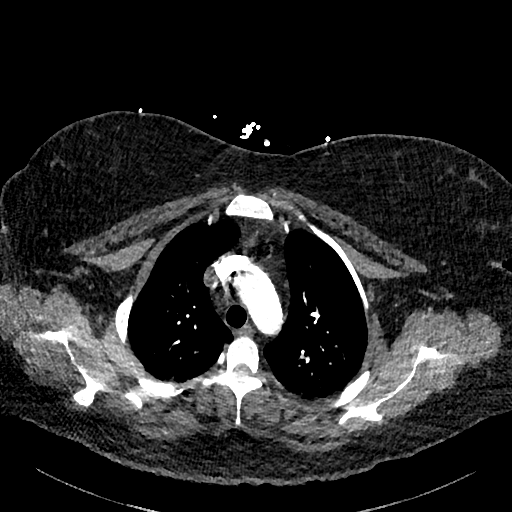
[im 254/308  lung]
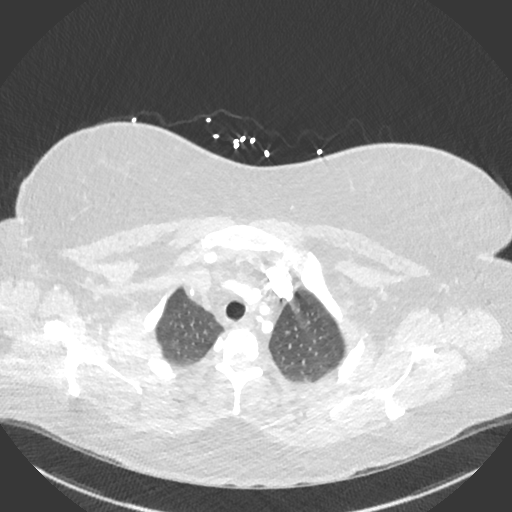
[im 267/308  soft-tissue]
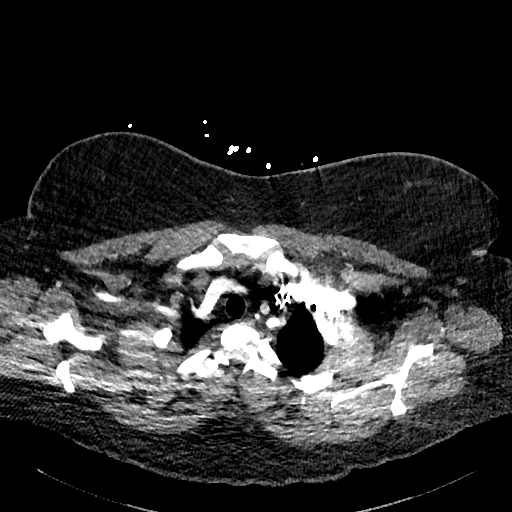
[im 294/308  lung]
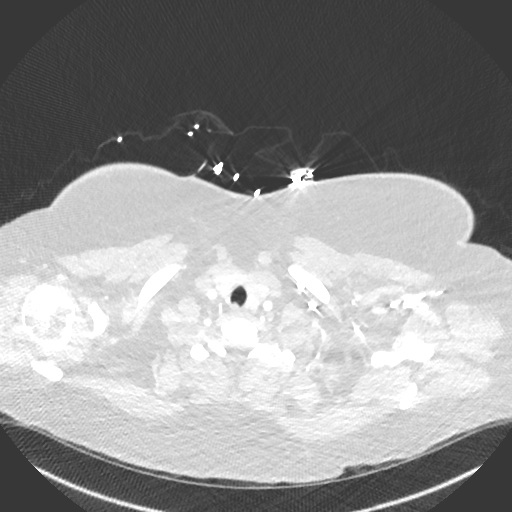

[Series 8: cor · coronal · 0.43mm/px · 3 of 151 slices shown]
[im 38/151  soft-tissue]
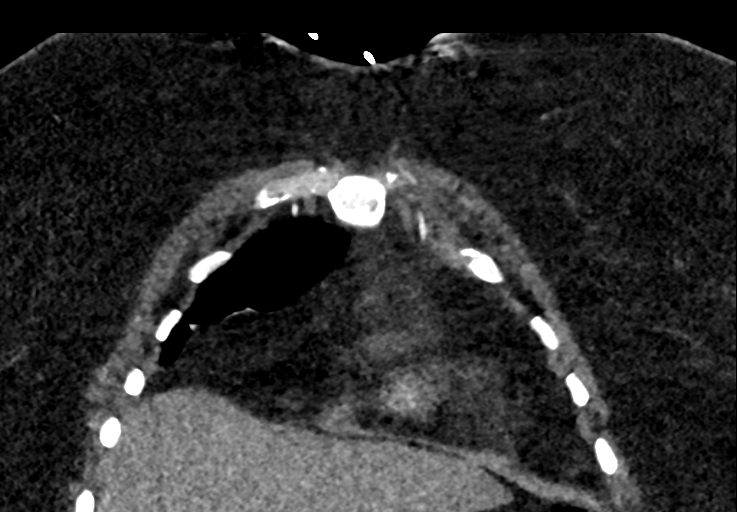
[im 76/151  soft-tissue]
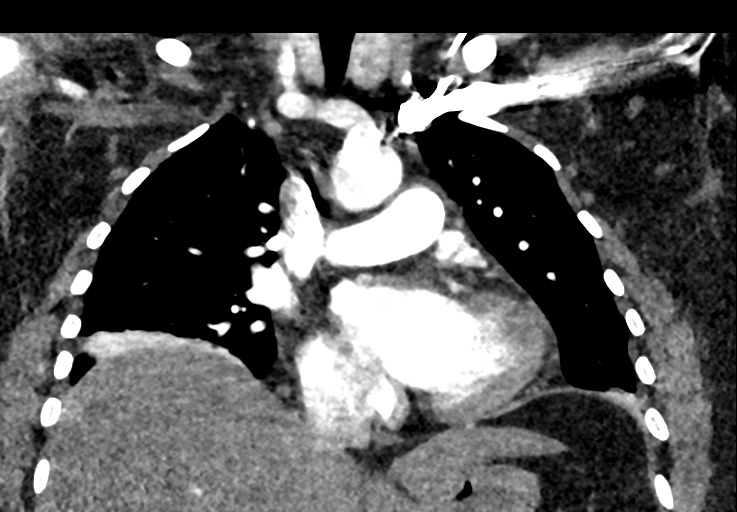
[im 113/151  soft-tissue]
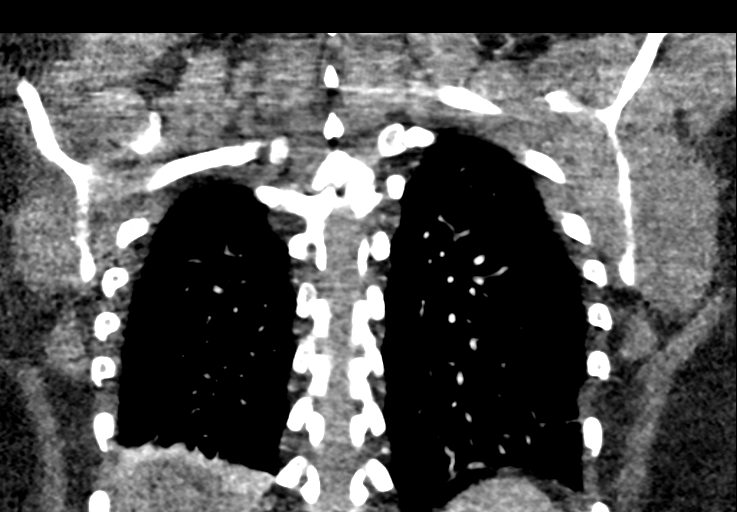

[18 of 46 positions shown; findings below may reference images not displayed]

FINDINGS: Cardiovascular:  There is no evidence of pulmonary embolus.

The heart is borderline normal in size. The thoracic aorta is
unremarkable in appearance. The great vessels are within normal
limits.

Mediastinum/Nodes: No mediastinal lymphadenopathy is seen. No
pericardial effusion is identified. A small hypodensity at the left
thyroid lobe is likely benign, given its size. No axillary
lymphadenopathy is seen.

Lungs/Pleura: Mild bibasilar atelectasis or scarring is noted, more
prominent on the right. No pleural effusion or pneumothorax is seen.
No masses are identified.

Upper Abdomen: The visualized portions of the liver and spleen are
grossly unremarkable.

Musculoskeletal: No acute osseous abnormalities are identified. The
visualized musculature is unremarkable in appearance.

Review of the MIP images confirms the above findings.
IMPRESSION: 1. No evidence of pulmonary embolus.
2. Mild bibasilar atelectasis or scarring, more prominent on the
right.

## 2021-09-22 ENCOUNTER — Encounter (HOSPITAL_COMMUNITY): Payer: Self-pay

## 2021-09-22 ENCOUNTER — Emergency Department (HOSPITAL_COMMUNITY)
Admission: EM | Admit: 2021-09-22 | Discharge: 2021-09-22 | Disposition: A | Discharge status: Home / Self Care | Payer: 59 | Attending: Student | Admitting: Student

## 2021-09-22 ENCOUNTER — Other Ambulatory Visit: Payer: Self-pay

## 2021-09-22 DIAGNOSIS — M79661 Pain in right lower leg: Secondary | ICD-10-CM | POA: Diagnosis present

## 2021-09-22 LAB — COMPREHENSIVE METABOLIC PANEL
ALT: 27 U/L (ref 0–44)
AST: 15 U/L (ref 15–41)
Albumin: 3.8 g/dL (ref 3.5–5.0)
Alkaline Phosphatase: 54 U/L (ref 38–126)
Anion gap: 6 (ref 5–15)
BUN: 12 mg/dL (ref 6–20)
CO2: 25 mmol/L (ref 22–32)
Calcium: 8.9 mg/dL (ref 8.9–10.3)
Chloride: 104 mmol/L (ref 98–111)
Creatinine, Ser: 0.73 mg/dL (ref 0.44–1.00)
GFR, Estimated: >60 mL/min (ref >60)
Glucose, Bld: 113 mg/dL — ABNORMAL HIGH (ref 70–99)
Potassium: 3.8 mmol/L (ref 3.5–5.1)
Sodium: 135 mmol/L (ref 135–145)
Total Bilirubin: 0.4 mg/dL (ref 0.3–1.2)
Total Protein: 7.7 g/dL (ref 6.5–8.1)

## 2021-09-22 LAB — CBC
HCT: 42.4 % (ref 36.0–46.0)
Hemoglobin: 14.4 g/dL (ref 12.0–15.0)
MCH: 32.1 pg (ref 26.0–34.0)
MCHC: 34 g/dL (ref 30.0–36.0)
MCV: 94.4 fL (ref 80.0–100.0)
Platelets: 257 10*3/uL (ref 150–400)
RBC: 4.49 MIL/uL (ref 3.87–5.11)
RDW: 12.3 % (ref 11.5–15.5)
WBC: 6.6 10*3/uL (ref 4.0–10.5)
nRBC: 0 % (ref 0.0–0.2)

## 2021-09-22 LAB — D-DIMER, QUANTITATIVE: D-Dimer, Quant: 0.41 ug/mL-FEU (ref 0.00–0.50)

## 2021-09-22 MED ORDER — ENOXAPARIN SODIUM 150 MG/ML IJ SOSY
1.0000 mg/kg | PREFILLED_SYRINGE | Freq: Once | INTRAMUSCULAR | Status: AC
  Administered 2021-09-22: 123 mg via SUBCUTANEOUS
  Filled 2021-09-22: qty 1

## 2021-09-22 NOTE — Discharge Instructions (Signed)
Follow instructions and return tomorrow for DVT study.  You have been given a dose of Lovenox today to cover for potential blood clot.

## 2021-09-22 NOTE — ED Triage Notes (Signed)
Pt presents with right lower leg pain that began 2 days ago, pt is concerned for blood clot and reports H/O PE in 2017

## 2021-09-22 NOTE — ED Provider Notes (Signed)
[facility] EMERGENCY DEPARTMENT Provider Note   CSN: [id number] Arrival date & time: 09/22/21  1730     History  Chief Complaint  Patient presents with   Leg Pain    Brianna Russell is a 31 y.o. female.  Brianna Russell is a 31 y.o. female with history of prior PE, anxiety, depression, who presents to the emergency department with concern for possible DVT.  Patient reports for the past 2 days she has been having pain in the right lower leg starting at the top of the calf muscle.  She reports it is sore and worse with movement.  She has not noticed significant swelling or any overlying skin changes.  Denies any inciting injury.  No associated chest pain or shortness of breath.  Prior PE occurred after her C-section, no longer on anticoagulation.  No recent long distance travel or surgery.  The history is provided by the patient.  Leg Pain Associated symptoms: no fever        Home Medications Prior to Admission medications   Medication Sig Start Date End Date Taking? Authorizing Provider  TRI-LO-MARZIA 0.18/0.215/0.25 MG-25 MCG tab Take 1 tablet by mouth every evening.  09/23/18   [provider]      Allergies    Ibuprofen    Review of Systems   Review of Systems  Constitutional:  Negative for chills and fever.  Respiratory:  Negative for shortness of breath.   Cardiovascular:  Negative for chest pain and leg swelling.  Musculoskeletal:  Positive for myalgias. Negative for arthralgias and joint swelling.    Physical Exam Updated Vital Signs BP 121/72   Pulse 86   Temp 97.9 F (36.6 C) (Oral)   Resp 18   Ht 5\' 5"  (1.651 m)   Wt 122.5 kg   LMP 08/02/2021 Comment: PCOS  SpO2 96%   BMI 44.93 kg/m  Physical Exam Vitals and nursing note reviewed.  Constitutional:      General: She is not in acute distress.    Appearance: Normal appearance. She is well-developed. She is not ill-appearing or diaphoretic.  HENT:     Head: Normocephalic and atraumatic.   Eyes:     General:        Right eye: No discharge.        Left eye: No discharge.  Cardiovascular:     Rate and Rhythm: Normal rate and regular rhythm.     Heart sounds: Normal heart sounds.  Pulmonary:     Effort: Pulmonary effort is normal. No respiratory distress.     Breath sounds: Normal breath sounds.  Musculoskeletal:     Comments: Right leg with tenderness over the proximal calf without palpable deformity or palpable cord, no overlying erythema, warmth or bruising.  No focal tenderness or swelling over the knee or ankle joint.  Distal pulses 2+, normal sensation and strength  Neurological:     Mental Status: She is alert and oriented to person, place, and time.     Coordination: Coordination normal.  Psychiatric:        Mood and Affect: Mood normal.        Behavior: Behavior normal.     ED Results / Procedures / Treatments   Labs (all labs ordered are listed, but only abnormal results are displayed) Labs Reviewed  COMPREHENSIVE METABOLIC PANEL - Abnormal; Notable for the following components:      Result Value   Glucose, Bld 113 (*)    All other components within normal  limits  CBC  D-DIMER, QUANTITATIVE    EKG None  Radiology No results found.  Procedures Procedures    Medications Ordered in ED Medications  enoxaparin (LOVENOX) injection 123 mg (123 mg Subcutaneous Given 09/22/21 2245)    ED Course/ Medical Decision Making/ A&P                           Medical Decision Making Amount and/or Complexity of Data Reviewed Labs: ordered.  Risk Prescription drug management.   31 year old female presents with right calf pain for 2 days, history of prior PE in the setting of C-section, not currently on anticoagulation and concern for possible DVT, no swelling, no focal bony or joint tenderness.  Tenderness over the proximal aspect of the right calf muscle, no overlying erythema or warmth, no concern for infection.  Ultrasound is not available at this  time The department, also will plan to give patient a shot of Lovenox to cover for potential DVT and will have patient return in the morning for outpatient DVT ultrasound.  She expresses understanding and agreement with this plan.  Discussed appropriate return precautions and outpatient follow-up.  Discharged home in good condition.        Final Clinical Impression(s) / ED Diagnoses Final diagnoses:  Right calf pain    Rx / DC Orders ED Discharge Orders          Ordered    US Venous Img Lower Unilateral Right        09/22/21 2231              Dartha Lodge, [place] 10/04/21 1215    Glendora Score, MD 10/11/21 [phone]

## 2023-10-21 ENCOUNTER — Other Ambulatory Visit: Payer: Self-pay | Admitting: *Deleted

## 2023-10-21 DIAGNOSIS — R1031 Right lower quadrant pain: Secondary | ICD-10-CM

## 2023-10-22 ENCOUNTER — Ambulatory Visit (INDEPENDENT_AMBULATORY_CARE_PROVIDER_SITE_OTHER): Admitting: General Surgery

## 2023-10-22 ENCOUNTER — Other Ambulatory Visit: Payer: Self-pay

## 2023-10-22 ENCOUNTER — Encounter: Payer: Self-pay | Admitting: General Surgery

## 2023-10-22 VITALS — BP 102/70 | HR 78 | Temp 98.1°F | Resp 14 | Ht 65.0 in | Wt 255.0 lb

## 2023-10-22 DIAGNOSIS — R1031 Right lower quadrant pain: Secondary | ICD-10-CM | POA: Diagnosis not present

## 2023-10-22 NOTE — Progress Notes (Signed)
 Brianna Russell; [id number]; 1990/05/13   HPI Patient is a 33 year old white female who returns to my care from Dr. Atilano for evaluation and treatment of right groin pain.  She states she started having right groin pain 2 months ago.  She has started to workout in the gym.  She states the pain is made worse with straining.  She denies any lump in that region.  She states the pain comes and goes.    Past Medical History:  Diagnosis Date   Anxiety    Bilateral pulmonary embolism (HCC) 09/03/2015   Depression    Gestational diabetes    Hx of varicella    Pleurisy     Past Surgical History:  Procedure Laterality Date   CESAREAN SECTION     CESAREAN SECTION N/A 08/05/2015   Procedure: REPEAT CESAREAN SECTION;  Surgeon: Charlie Croak, MD;  Location: [facility] BIRTHING SUITES;  Service: Obstetrics;  Laterality: N/A;  Dwayne T to RNFA - confirmed edc 08/23/15     CHOLECYSTECTOMY     INCISIONAL HERNIA REPAIR N/A 10/20/2018   Procedure: SHIRLENE HERNIORRHAPHY WITH MESH;  Surgeon: Mavis Anes, MD;  Location: [facility];  Service: General;  Laterality: N/A;    Family History  Problem Relation Age of Onset   Diabetes Father    Stroke Father    Hypertension Father    Heart disease Father    Cancer Paternal Aunt    Cancer Paternal Grandmother    Cancer Paternal Grandfather     Current Outpatient Medications on File Prior to Visit  Medication Sig Dispense Refill   famotidine  (PEPCID ) 40 MG/5ML suspension Take 20 mg by mouth 2 (two) times daily. (Patient taking differently: Take 20 mg by mouth as needed for indigestion or heartburn.)     OZEMPIC, 0.25 OR 0.5 MG/DOSE, 2 MG/3ML SOPN Inject 0.5 mg into the skin once a week.     No current facility-administered medications on file prior to visit.    Allergies  Allergen Reactions   Ibuprofen  Nausea Only, Hives and Other (See Comments)    Headache  Headache  Headache  Headache  Headache Headache Headache    Headache    Headache Headache     Social History   Substance and Sexual Activity  Alcohol Use No   Alcohol/week: 0.0 standard drinks of alcohol    Social History   Tobacco Use  Smoking Status Never   Passive exposure: Never  Smokeless Tobacco Never    Review of Systems  Constitutional:  Positive for malaise/fatigue.  HENT: Negative.    Eyes: Negative.   Respiratory:  Positive for shortness of breath.   Cardiovascular: Negative.   Gastrointestinal:  Positive for abdominal pain, heartburn and nausea.  Genitourinary: Negative.   Musculoskeletal: Negative.   Skin: Negative.   Neurological: Negative.   Endo/Heme/Allergies: Negative.   Psychiatric/Behavioral: Negative.      Objective   Vitals:   10/22/23 1445  BP: 102/70  Pulse: 78  Resp: 14  Temp: 98.1 F (36.7 C)  SpO2: 97%    Physical Exam Vitals reviewed.  Constitutional:      Appearance: Normal appearance. She is obese. She is not ill-appearing.  HENT:     Head: Normocephalic and atraumatic.  Cardiovascular:     Rate and Rhythm: Normal rate and regular rhythm.     Heart sounds: Normal heart sounds. No murmur heard.    No friction rub. No gallop.  Pulmonary:     Effort: Pulmonary effort is normal. No respiratory  distress.     Breath sounds: Normal breath sounds. No stridor. No wheezing, rhonchi or rales.  Abdominal:     General: Bowel sounds are normal. There is no distension.     Palpations: Abdomen is soft. There is no mass.     Tenderness: There is no abdominal tenderness. There is no guarding or rebound.     Hernia: No hernia is present.     Comments: Patient does have some pain to palpation over the right internal ring, but I could not elicit a hernia.  I could not feel for right femoral hernia.  Examination is somewhat limited secondary to body habitus, though I was not able to palpate a hernia when lying down or standing.  Skin:    General: Skin is warm and dry.  Neurological:     Mental Status: She is alert and oriented to  person, place, and time.     Assessment  Right groin pain.  On exam, I do not feel a discrete inguinal hernia on the right side.  This pain may be secondary to muscular strain as she has started working out recently. Plan  I told her that we will monitor this pain.  She will return in 6 weeks if it has not improved.  She should avoid any activity that puts a strain on her groin area.  May consider CT scan of pelvis or laparoscopy should her symptoms continue or worsen.  She agrees to this treatment plan.  She will call to make the appointment in 6 weeks should it not have improved.

## 2023-11-15 ENCOUNTER — Ambulatory Visit
Admission: EM | Admit: 2023-11-15 | Discharge: 2023-11-15 | Disposition: A | Discharge status: Home / Self Care | Attending: Family Medicine | Admitting: Family Medicine

## 2023-11-15 ENCOUNTER — Encounter: Payer: Self-pay | Admitting: Emergency Medicine

## 2023-11-15 DIAGNOSIS — R42 Dizziness and giddiness: Secondary | ICD-10-CM

## 2023-11-15 MED ORDER — ONDANSETRON HCL 4 MG/5ML PO SOLN: 4.0000 mg | Freq: Three times a day (TID) | ORAL | 0 refills | Status: AC | PRN

## 2023-11-15 MED ORDER — MECLIZINE HCL 25 MG PO CHEW
25.0000 mg | CHEWABLE_TABLET | Freq: Three times a day (TID) | ORAL | 0 refills | Status: AC | PRN
Start: 2023-11-15 — End: ?

## 2023-11-15 NOTE — ED Provider Notes (Signed)
 [facility]    CSN: [id number] Arrival date & time: 11/15/23  0932      History   Chief Complaint No chief complaint on file.   HPI Brianna Russell is a 33 y.o. female.   Patient presenting today with new onset dizziness that started in the middle of the night on Wednesday when she got up to go to the bathroom.  She states it feels like the room is spinning around her and is worse with laying down, fast head movements, looking up or down.  Denies visual change, recent head injury, mental status changes, weakness numbness or tingling, chest pain, shortness of breath, palpitations but does have some nausea when the room spinning dizziness is happening.  So far not trying anything over-the-counter for symptoms.  States she has had similar episodes in the past from time to time but they have never lasted this long and she has never gone and sought care for them.  She notes she has been keeping track of her blood sugar since onset of symptoms and they have been within normal limits.    Past Medical History:  Diagnosis Date   Anxiety    Bilateral pulmonary embolism (HCC) 09/03/2015   Depression    Gestational diabetes    Hx of varicella    Pleurisy     Patient Active Problem List   Diagnosis Date Noted   Incisional hernia, without obstruction or gangrene    Morbid (severe) obesity due to excess calories (HCC) 09/15/2015   Pleural effusion on right sp PE likely onset 08/19/15 09/15/2015   Bradycardia 09/04/2015   Gestational diabetes 09/04/2015   Bilateral pulmonary embolism (HCC) 09/03/2015   Obesity, morbid (HCC) 09/03/2015   Normocytic anemia 09/03/2015   Pregnancy 08/05/2015    Past Surgical History:  Procedure Laterality Date   CESAREAN SECTION     CESAREAN SECTION N/A 08/05/2015   Procedure: REPEAT CESAREAN SECTION;  Surgeon: Charlie Croak, MD;  Location: [facility] BIRTHING [facility];  Service: Obstetrics;  Laterality: N/A;  Dwayne T to RNFA - confirmed edc 08/23/15      CHOLECYSTECTOMY     INCISIONAL HERNIA REPAIR N/A 10/20/2018   Procedure: SHIRLENE HERNIORRHAPHY WITH MESH;  Surgeon: Mavis Anes, MD;  Location: [facility];  Service: General;  Laterality: N/A;    OB History     Gravida  2   Para  2   Term  1   Preterm      AB      Living  2      SAB      IAB      Ectopic      Multiple  0   Live Births  2            Home Medications    Prior to Admission medications   Medication Sig Start Date End Date Taking? Authorizing Provider  Meclizine  HCl 25 MG CHEW Chew 1 tablet (25 mg total) by mouth 3 (three) times daily as needed. May cause drowsiness 11/15/23  Yes Stuart Vernell Norris, PA-C  ondansetron  (ZOFRAN ) 4 MG/5ML solution Take 5 mLs (4 mg total) by mouth every 8 (eight) hours as needed for nausea or vomiting. 11/15/23  Yes Stuart Vernell Norris, PA-C  famotidine  (PEPCID ) 40 MG/5ML suspension Take 20 mg by mouth 2 (two) times daily. Patient taking differently: Take 20 mg by mouth as needed for indigestion or heartburn.    [provider]  OZEMPIC, 0.25 OR 0.5 MG/DOSE, 2 MG/3ML SOPN  Inject 0.5 mg into the skin once a week. 09/30/23   [provider]    Family History Family History  Problem Relation Age of Onset   Diabetes Father    Stroke Father    Hypertension Father    Heart disease Father    Cancer Paternal Aunt    Cancer Paternal Grandmother    Cancer Paternal Grandfather     Social History Social History   Tobacco Use   Smoking status: Never    Passive exposure: Never   Smokeless tobacco: Never  Vaping Use   Vaping status: Never Used  Substance Use Topics   Alcohol use: No    Alcohol/week: 0.0 standard drinks of alcohol   Drug use: No     Allergies   Ibuprofen    Review of Systems Review of Systems Per HPI  Physical Exam Triage Vital Signs ED Triage Vitals  Encounter Vitals Group     BP 11/15/23 0945 106/73     Girls Systolic BP Percentile --      Girls Diastolic BP  Percentile --      Boys Systolic BP Percentile --      Boys Diastolic BP Percentile --      Pulse Rate 11/15/23 0945 82     Resp 11/15/23 0945 18     Temp 11/15/23 0945 98.1 F (36.7 C)     Temp Source 11/15/23 0945 Oral     SpO2 11/15/23 0945 98 %     Weight --      Height --      Head Circumference --      Peak Flow --      Pain Score 11/15/23 0946 0     Pain Loc --      Pain Education --      Exclude from Growth Chart --    Orthostatic VS for the past 24 hrs:  BP- Lying Pulse- Lying BP- Sitting Pulse- Sitting BP- Standing at 0 minutes Pulse- Standing at 0 minutes  11/15/23 0948 116/79 85 112/76 93 107/75 92    Updated Vital Signs BP 106/73 (BP Location: Left Arm)   Pulse 82   Temp 98.1 F (36.7 C) (Oral)   Resp 18   LMP 11/05/2023 (Approximate)   SpO2 98%   Visual Acuity Right Eye Distance:   Left Eye Distance:   Bilateral Distance:    Right Eye Near:   Left Eye Near:    Bilateral Near:     Physical Exam Vitals and nursing note reviewed.  Constitutional:      Appearance: Normal appearance. She is not ill-appearing.  HENT:     Head: Atraumatic.     Mouth/Throat:     Mouth: Mucous membranes are moist.     Pharynx: Oropharynx is clear.  Eyes:     Extraocular Movements: Extraocular movements intact.     Conjunctiva/sclera: Conjunctivae normal.     Pupils: Pupils are equal, round, and reactive to light.  Cardiovascular:     Rate and Rhythm: Normal rate and regular rhythm.     Heart sounds: Normal heart sounds.  Pulmonary:     Effort: Pulmonary effort is normal.     Breath sounds: Normal breath sounds.  Musculoskeletal:        General: Normal range of motion.     Cervical back: Normal range of motion and neck supple.  Skin:    General: Skin is warm and dry.  Neurological:     General: No focal deficit  present.     Mental Status: She is alert and oriented to person, place, and time.     Cranial Nerves: No cranial nerve deficit.     Motor: No weakness.      Gait: Gait normal.  Psychiatric:        Mood and Affect: Mood normal.        Thought Content: Thought content normal.        Judgment: Judgment normal.      UC Treatments / Results  Labs (all labs ordered are listed, but only abnormal results are displayed) Labs Reviewed - No data to display  EKG   Radiology No results found.  Procedures Procedures (including critical care time)  Medications Ordered in UC Medications - No data to display  Initial Impression / Assessment and Plan / UC Course  I have reviewed the triage vital signs and the nursing notes.  Pertinent labs & imaging results that were available during my care of the patient were reviewed by me and considered in my medical decision making (see chart for details).     Vital signs and exam very reassuring with no focal neurologic deficits or concerning findings.  Strongly suspect positional vertigo causing symptoms.  She declines a further workup at this time and wishes to try the medication and Epley maneuvers to see if this resolves symptoms.  PCP follow-up if not resolving over the next week, return sooner if worsening in any time.  Final Clinical Impressions(s) / UC Diagnoses   Final diagnoses:  Vertigo   Discharge Instructions   None    ED Prescriptions     Medication Sig Dispense Auth. Provider   Meclizine  HCl 25 MG CHEW Chew 1 tablet (25 mg total) by mouth 3 (three) times daily as needed. May cause drowsiness 30 tablet Stuart Vernell Norris, PA-C   ondansetron  (ZOFRAN ) 4 MG/5ML solution Take 5 mLs (4 mg total) by mouth every 8 (eight) hours as needed for nausea or vomiting. 100 mL Stuart Vernell Norris, [place]      PDMP not reviewed this encounter.   Stuart Vernell Norris, [place] 11/15/23 1127

## 2023-11-15 NOTE — ED Triage Notes (Signed)
 Dizziness since Wednesday.  Headache yesterday.

## 2024-01-02 NOTE — Progress Notes (Signed)
 Assessment/Plan:   Assessment & Plan Benign paroxysmal positional vertigo, right side Vertigo managed with Epley maneuvers and head exercises. Does seem to be improving with vestibular PT. Meclizine  prescribed for acute dizziness. Feeling pretty well today.  - Prescribe Meclizine  for acute dizziness episodes. - Continue to participate in vestibular rehab  Type 2 diabetes mellitus Type 2 diabetes managed with Ozempic. A1c previously 7.1. Plan to monitor kidney function. - A1c, UACR, BMP - Continue Ozempic 1mg  weekly  Obstructive sleep apnea, suspected She has not contacted sleep specialists. Needs sleep study.  - Instruct her to contact sleep clinic for follow-up.   Assessment & Plan Benign paroxysmal positional vertigo of right ear  Orders: .  meclizine  (DRAMAMINE, MECLIZINE ,) 25 mg tablet; Chew 1 tablet (25 mg total) Three (3) times a day as needed.  Type 2 diabetes mellitus with hyperglycemia, without long-term current use of insulin (CMS-HCC)    Orders: .  Basic metabolic panel; Future .  Hemoglobin A1c; Future .  Albumin/creatinine urine ratio; Future   No follow-ups on file.   Subjective:   Chief Complaint: Dizziness    History of Present Illness Brianna Russell is a 33 year old female with diabetes who presents with dizziness and low blood pressure concerns.  Dizziness and vertigo - Persistent dizziness interferes with daily activities, including driving - Performs Epley maneuvers and head exercises for symptom management with good effect, but incomplete resolution of symptoms - Uses meclizine  for acute episodes of dizziness - Requests alternative forms of meclizine  due to side effects   Diabetes mellitus - Diabetes managed with Ozempic - Most recent hemoglobin A1c is 7.1% - No recent dose increase due to refill issues  Sleep disturbance - No follow-up with sleep specialists regarding ongoing sleep issues     Objective:    Physical  Exam Vitals reviewed.  Constitutional:      General: She is not in acute distress. HENT:     Mouth/Throat:     Mouth: Mucous membranes are moist.  Pulmonary:     Effort: Pulmonary effort is normal. No respiratory distress.  Abdominal:     General: There is no distension.  Musculoskeletal:        General: No swelling or deformity.  Neurological:     General: No focal deficit present.  Psychiatric:        Mood and Affect: Mood normal.     Visit Vitals BP 88/62  Pulse 74  Temp 36.6 C (97.8 F) (Temporal)  Resp 16  Ht 165.1 cm (5' 5)  Wt (!) 113.3 kg (249 lb 12.8 oz)  SpO2 99%  BMI 41.57 kg/m   BP Readings from Last 3 Encounters:  01/02/24 88/62  12/10/23 98/76  07/21/21 116/76   Wt Readings from Last 3 Encounters:  01/02/24 (!) 113.3 kg (249 lb 12.8 oz)  12/10/23 (!) 114.2 kg (251 lb 11.2 oz)  07/21/21 (!) 122.5 kg (270 lb)

## 2024-02-19 ENCOUNTER — Ambulatory Visit (INDEPENDENT_AMBULATORY_CARE_PROVIDER_SITE_OTHER): Admitting: Neurology

## 2024-02-19 ENCOUNTER — Encounter: Payer: Self-pay | Admitting: Neurology

## 2024-02-19 VITALS — BP 99/66 | HR 109 | Ht 64.0 in | Wt 247.6 lb

## 2024-02-19 DIAGNOSIS — R519 Headache, unspecified: Secondary | ICD-10-CM | POA: Diagnosis not present

## 2024-02-19 DIAGNOSIS — Z86711 Personal history of pulmonary embolism: Secondary | ICD-10-CM | POA: Diagnosis not present

## 2024-02-19 DIAGNOSIS — Z9189 Other specified personal risk factors, not elsewhere classified: Secondary | ICD-10-CM | POA: Diagnosis not present

## 2024-02-19 DIAGNOSIS — G4719 Other hypersomnia: Secondary | ICD-10-CM

## 2024-02-19 NOTE — Progress Notes (Signed)
 Subjective:    Patient ID: Brianna Russell is a 33 y.o. female.  HPI    True Mar, MD, PhD [facility] [address], [address] [address] [place], [place] [address]   Dear Dr. Marlee,  I saw your patient, Brianna Russell, upon your kind request in my sleep clinic today for initial consultation of her sleep disorder, in particular, concern for underlying obstructive sleep apnea.  The patient is unaccompanied today.  As you know, Brianna Russell is a 33 year old female with an underlying medical history of diabetes, vertigo, history of PE in 2017 (no longer on blood thinner), dysphagia, vocal cord cyst, history of positive ANA, anxiety, and severe obesity with a BMI of over 40, who reports worsening daytime somnolence and morning headaches.  Her Epworth sleepiness score is 12 out of 24, fatigue severity score is 63 out of 63.  I reviewed your office note from 01/02/2024.  She has never had a sleep study.  She is not aware of any family history of sleep apnea.  She lives with her family including husband and 2 children, ages 33 and 48.  They have a German Shepherd in the house.  The dog does not sleep on the bed with them.  She does have a TV in the bedroom but it is typically not on at night.  She is working on weight loss and has lost about 35 pounds within the past year.  She has no nightly nocturia, she has had occasional morning headaches.  She is a non-smoker.  She drinks less caffeine now, has reduced her caffeine intake to less than 1 cup of coffee per day.  She drinks alcohol very occasionally.  Bedtime is generally between 9 and 10 and rise time between 4 and 5.  She works for [employer] in [employer].  Her Past Medical History Is Significant For: Past Medical History:  Diagnosis Date   Anxiety    Bilateral pulmonary embolism (HCC) 09/03/2015   Depression    Gestational diabetes    Hx of varicella    Pleurisy     Her Past Surgical History Is Significant  For: Past Surgical History:  Procedure Laterality Date   CESAREAN SECTION     CESAREAN SECTION N/A 08/05/2015   Procedure: REPEAT CESAREAN SECTION;  Surgeon: Charlie Croak, MD;  Location: [facility] SU[facility];  Service: Obstetrics;  Laterality: N/A;  Dwayne T to RNFA - confirmed edc 08/23/15     CHOLECYSTECTOMY     INCISIONAL HERNIA REPAIR N/A 10/20/2018   Procedure: SHIRLENE HERNIORRHAPHY WITH MESH;  Surgeon: Mavis Anes, MD;  Location: [facility];  Service: General;  Laterality: N/A;    Her Family History Is Significant For: Family History  Problem Relation Age of Onset   Diabetes Father    Stroke Father    Hypertension Father    Heart disease Father    Cancer Paternal Aunt    Cancer Paternal Grandmother    Cancer Paternal Grandfather    Sleep apnea Neg Hx     Her Social History Is Significant For: Social History   Socioeconomic History   Marital status: Married    Spouse name: Not on file   Number of children: 2   Years of education: Not on file   Highest education level: Not on file  Occupational History   Not on file  Tobacco Use   Smoking status: Never    Passive exposure: Never   Smokeless tobacco: Never  Vaping Use  Vaping status: Never Used  Substance and Sexual Activity   Alcohol use: No    Alcohol/week: 0.0 standard drinks of alcohol   Drug use: No   Sexual activity: Yes    Birth control/protection: Surgical    Comment: spouse has had vasectomy  Other Topics Concern   Not on file  Social History Narrative   Pt lives with family    Pt works    Social Drivers of Corporate Investment Banker Strain: Not on file  Food Insecurity: No Food Insecurity (12/10/2023)   Received from [facility]   Hunger Vital Sign    Within the past 12 months, you worried that your food would run out before you got the money to buy more.: Never true    Within the past 12 months, the food you bought just didn't last and you didn't have money to get more.: Never true   Transportation Needs: No Transportation Needs (12/10/2023)   Received from [facility] - Transportation    Lack of Transportation (Medical): No    Lack of Transportation (Non-Medical): No  Physical Activity: Not on file  Stress: Not on file  Social Connections: Not on file    Her Allergies Are:  Allergies  Allergen Reactions   Ibuprofen  Nausea Only, Hives and Other (See Comments)    Headache  Headache  Headache  Headache  Headache Headache Headache    Headache    Headache Headache  :   Her Current Medications Are:  Outpatient Encounter Medications as of 02/19/2024  Medication Sig   OZEMPIC, 0.25 OR 0.5 MG/DOSE, 2 MG/3ML SOPN Inject 0.5 mg into the skin once a week.   famotidine  (PEPCID ) 40 MG/5ML suspension Take 20 mg by mouth 2 (two) times daily. (Patient taking differently: Take 20 mg by mouth as needed for indigestion or heartburn.)   Meclizine  HCl 25 MG CHEW Chew 1 tablet (25 mg total) by mouth 3 (three) times daily as needed. May cause drowsiness   ondansetron  (ZOFRAN ) 4 MG/5ML solution Take 5 mLs (4 mg total) by mouth every 8 (eight) hours as needed for nausea or vomiting.   No facility-administered encounter medications on file as of 02/19/2024.  :   Review of Systems:  Out of a complete 14 Russell review of systems, all are reviewed and negative with the exception of these symptoms as listed below:   Review of Systems  Objective:  Neurological Exam  Physical Exam Physical Examination:   Vitals:   02/19/24 1341  BP: 99/66  Pulse: (!) 109    General Examination: The patient is a very pleasant 33 y.o. female in no acute distress. She appears well-developed and well-nourished and well groomed.   HEENT: Normocephalic, atraumatic, pupils are equal, round and reactive to light, extraocular tracking is good without limitation to gaze excursion or nystagmus noted. No photophobia.  Corrective eye glasses in place. Hearing is grossly intact.  Face  is symmetric with normal facial animation. Speech is clear without dysarthria. There is no hypophonia. There is no lip, neck/head, jaw or voice tremor. Neck is supple with full range of passive and active motion. There are no carotid bruits on auscultation.  Airway/Oropharynx exam reveals: No significant mouth dryness, good dental hygiene, moderate airway crowding secondary to Mallampati class III, small airway entry with wider tongue, tonsils on the small side.  Tongue protrudes centrally and palate elevates symmetrically, neck circumference 16 1/8 inches.  Minimal overbite noted.    Chest: Clear  to auscultation without wheezing, rhonchi or crackles noted.  Heart: S1+S2+0, regular and normal without murmurs, rubs or gallops noted.   Abdomen: Soft, non-tender and non-distended.  Extremities: There is no pitting edema in the distal lower extremities bilaterally.   Skin: Warm and dry without trophic changes noted.   Musculoskeletal: exam reveals no obvious joint deformities.   Neurologically:  Mental status: The patient is awake, alert and oriented in all 4 spheres. Her immediate and remote memory, attention, language skills and fund of knowledge are appropriate. There is no evidence of aphasia, agnosia, apraxia or anomia. Speech is clear with normal prosody and enunciation. Thought process is linear. Mood is normal and affect is normal.  Cranial nerves II - XII are as described above under HEENT exam.  Motor exam: Normal bulk, strength and tone is noted. There is no obvious action or resting tremor.  Fine motor skills and coordination: Intact grossly.  Cerebellar testing: No dysmetria or intention tremor. There is no truncal or gait ataxia.  Sensory exam: intact to light touch in the upper and lower extremities.  Gait, station and balance: She stands easily. No veering to one side is noted. No leaning to one side is noted. Posture is age-appropriate and stance is narrow based. Gait shows normal  stride length and normal pace. No problems turning are noted.   Assessment and Plan:   In summary, Brianna Russell is a very pleasant 33 y.o.-year old female with an underlying medical history of diabetes, vertigo, history of PE in 2017, dysphagia, vocal cord cyst, history of positive ANA, anxiety, and severe obesity with a BMI of over 40, whose history and physical exam are concerning for sleep disordered breathing, particularly obstructive sleep apnea (OSA). A laboratory attended sleep study is typically considered gold standard for evaluation of sleep disordered breathing.   I had a long chat with the patient about my findings and the diagnosis of sleep apnea, particularly OSA, its prognosis and treatment options. We talked about medical/conservative treatments, surgical interventions and non-pharmacological approaches for symptom control. I explained, in particular, the risks and ramifications of untreated moderate to severe OSA, especially with respect to developing cardiovascular disease down the road, including congestive heart failure (CHF), difficult to treat hypertension, cardiac arrhythmias (particularly A-fib), neurovascular complications including TIA, stroke and dementia. Even type 2 diabetes has, in part, been linked to untreated OSA. Symptoms of untreated OSA may include (but may not be limited to) daytime sleepiness, nocturia (i.e. frequent nighttime urination), memory problems, mood irritability and suboptimally controlled or worsening mood disorder such as depression and/or anxiety, lack of energy, lack of motivation, physical discomfort, as well as recurrent headaches, especially morning or nocturnal headaches. We talked about the importance of maintaining a healthy lifestyle and striving for healthy weight. In addition, we talked about the importance of striving for and maintaining good sleep hygiene. I recommended a sleep study at this time. I outlined the differences between a  laboratory attended sleep study which is considered more comprehensive and accurate over the option of a home sleep test (HST); the latter may lead to underestimation of sleep disordered breathing in some instances and does not help with diagnosing upper airway resistance syndrome and is not accurate enough to diagnose primary central sleep apnea typically. I outlined possible surgical and non-surgical treatment options of OSA, including the use of a positive airway pressure (PAP) device (i.e. CPAP, AutoPAP/APAP or BiPAP in certain circumstances), a custom-made dental device (aka oral appliance, which would require a  referral to a specialist dentist or orthodontist typically, and is generally speaking not considered for patients with full dentures or edentulous state), upper airway surgical options, such as traditional UPPP (which is not considered a first-line treatment) or the Inspire device (hypoglossal nerve stimulator, which would involve a referral for consultation with an ENT surgeon, after careful selection, following inclusion criteria - also not first-line treatment). I explained the PAP treatment option to the patient in detail, as this is generally considered first-line treatment.  The patient indicated that she would be willing to try PAP therapy, if the need arises. I explained the importance of being compliant with PAP treatment, not only for insurance purposes but primarily to improve patient's symptoms symptoms, and for the patient's long term health benefit, including to reduce Her cardiovascular risks longer-term.    We will pick up our discussion about the next steps and treatment options after testing.  We will keep her posted as to the test results by phone call and/or MyChart messaging where possible.  We will plan to follow-up in sleep clinic accordingly as well.  I answered all her questions today and the patient was in agreement.   I encouraged her to call with any interim questions,  concerns, problems or updates or email us  through MyChart.  Generally speaking, sleep test authorizations may take up to 2 weeks, sometimes less, sometimes longer, the patient is encouraged to get in touch with us  if they do not hear back from the sleep lab staff directly within the next 2 weeks.  Thank you very much for allowing me to participate in the care of this nice patient. If I can be of any further assistance to you please do not hesitate to call me at [phone].  Sincerely,   True Mar, MD, PhD
